# Patient Record
Sex: Male | Born: 1975 | Hispanic: Yes | Smoking: Former smoker
Health system: Southern US, Community
[De-identification: ages and names within clinical notes are randomized; demographics above are authoritative.]

## PROBLEM LIST (undated history)

## (undated) DIAGNOSIS — K5190 Ulcerative colitis, unspecified, without complications: Secondary | ICD-10-CM

## (undated) DIAGNOSIS — J45909 Unspecified asthma, uncomplicated: Secondary | ICD-10-CM

## (undated) DIAGNOSIS — F32A Depression, unspecified: Secondary | ICD-10-CM

## (undated) DIAGNOSIS — R7989 Other specified abnormal findings of blood chemistry: Secondary | ICD-10-CM

## (undated) DIAGNOSIS — R42 Dizziness and giddiness: Secondary | ICD-10-CM

## (undated) DIAGNOSIS — F419 Anxiety disorder, unspecified: Secondary | ICD-10-CM

## (undated) DIAGNOSIS — E7800 Pure hypercholesterolemia, unspecified: Secondary | ICD-10-CM

## (undated) HISTORY — PX: VASECTOMY: SHX75

## (undated) HISTORY — DX: Dizziness and giddiness: R42

## (undated) HISTORY — DX: Ulcerative colitis, unspecified, without complications: K51.90

## (undated) HISTORY — DX: Unspecified asthma, uncomplicated: J45.909

## (undated) HISTORY — DX: Other specified abnormal findings of blood chemistry: R79.89

## (undated) HISTORY — DX: Pure hypercholesterolemia, unspecified: E78.00

## (undated) HISTORY — DX: Anxiety disorder, unspecified: F41.9

## (undated) HISTORY — PX: MOUTH SURGERY: SHX715

## (undated) HISTORY — DX: Depression, unspecified: F32.A

---

## 2004-04-09 ENCOUNTER — Emergency Department (HOSPITAL_COMMUNITY): Admission: EM | Admit: 2004-04-09 | Discharge: 2004-04-10 | Payer: Self-pay | Admitting: Emergency Medicine

## 2004-05-07 ENCOUNTER — Ambulatory Visit: Payer: Self-pay | Admitting: Internal Medicine

## 2004-05-13 ENCOUNTER — Ambulatory Visit: Payer: Self-pay | Admitting: Internal Medicine

## 2006-09-01 HISTORY — PX: COLONOSCOPY: SHX174

## 2006-11-10 ENCOUNTER — Encounter: Admission: RE | Admit: 2006-11-10 | Discharge: 2006-11-10 | Payer: Self-pay | Admitting: Gastroenterology

## 2007-09-29 ENCOUNTER — Encounter: Payer: Self-pay | Admitting: Family Medicine

## 2008-05-10 ENCOUNTER — Encounter: Payer: Self-pay | Admitting: Family Medicine

## 2009-04-04 ENCOUNTER — Ambulatory Visit: Payer: Self-pay | Admitting: Family Medicine

## 2009-04-04 DIAGNOSIS — E7800 Pure hypercholesterolemia, unspecified: Secondary | ICD-10-CM | POA: Insufficient documentation

## 2009-04-04 DIAGNOSIS — R7989 Other specified abnormal findings of blood chemistry: Secondary | ICD-10-CM | POA: Insufficient documentation

## 2009-04-04 DIAGNOSIS — R945 Abnormal results of liver function studies: Secondary | ICD-10-CM

## 2009-04-04 DIAGNOSIS — K5190 Ulcerative colitis, unspecified, without complications: Secondary | ICD-10-CM | POA: Insufficient documentation

## 2009-04-05 ENCOUNTER — Encounter (INDEPENDENT_AMBULATORY_CARE_PROVIDER_SITE_OTHER): Payer: Self-pay | Admitting: *Deleted

## 2009-04-12 ENCOUNTER — Ambulatory Visit: Payer: Self-pay | Admitting: Family Medicine

## 2009-04-12 LAB — CONVERTED CEMR LAB
AST: 43 units/L — ABNORMAL HIGH (ref 0–37)
Albumin: 4 g/dL (ref 3.5–5.2)
Alkaline Phosphatase: 93 units/L (ref 39–117)
Cholesterol: 282 mg/dL — ABNORMAL HIGH (ref 0–200)
Direct LDL: 222.9 mg/dL
Total CHOL/HDL Ratio: 7
Triglycerides: 127 mg/dL (ref 0.0–149.0)
VLDL: 25.4 mg/dL (ref 0.0–40.0)

## 2009-04-18 LAB — CONVERTED CEMR LAB: HCV Ab: NEGATIVE

## 2009-04-26 ENCOUNTER — Telehealth: Payer: Self-pay | Admitting: Family Medicine

## 2009-11-06 ENCOUNTER — Ambulatory Visit: Payer: Self-pay | Admitting: Family Medicine

## 2009-11-06 DIAGNOSIS — R5381 Other malaise: Secondary | ICD-10-CM | POA: Insufficient documentation

## 2009-11-06 DIAGNOSIS — R5383 Other fatigue: Secondary | ICD-10-CM

## 2009-11-09 LAB — CONVERTED CEMR LAB
ALT: 48 units/L (ref 0–53)
Albumin: 4.4 g/dL (ref 3.5–5.2)
Basophils Absolute: 0 10*3/uL (ref 0.0–0.1)
CO2: 32 meq/L (ref 19–32)
Chloride: 106 meq/L (ref 96–112)
Eosinophils Absolute: 0.1 10*3/uL (ref 0.0–0.7)
Eosinophils Relative: 1.7 % (ref 0.0–5.0)
GFR calc non Af Amer: 117.27 mL/min (ref >60)
Glucose, Bld: 87 mg/dL (ref 70–99)
HCT: 44.3 % (ref 39.0–52.0)
HDL: 43.3 mg/dL (ref >39.00)
Hemoglobin: 14.8 g/dL (ref 13.0–17.0)
MCV: 83.5 fL (ref 78.0–100.0)
Platelets: 200 10*3/uL (ref 150.0–400.0)
RBC: 5.31 M/uL (ref 4.22–5.81)
Total Bilirubin: 0.8 mg/dL (ref 0.3–1.2)
Total Protein: 7.9 g/dL (ref 6.0–8.3)
Triglycerides: 170 mg/dL — ABNORMAL HIGH (ref 0.0–149.0)
VLDL: 34 mg/dL (ref 0.0–40.0)
WBC: 6 10*3/uL (ref 4.5–10.5)

## 2009-11-16 ENCOUNTER — Telehealth: Payer: Self-pay | Admitting: Internal Medicine

## 2009-11-16 ENCOUNTER — Ambulatory Visit: Payer: Self-pay | Admitting: Internal Medicine

## 2009-11-16 DIAGNOSIS — R209 Unspecified disturbances of skin sensation: Secondary | ICD-10-CM | POA: Insufficient documentation

## 2009-11-19 ENCOUNTER — Telehealth: Payer: Self-pay | Admitting: Internal Medicine

## 2009-11-19 LAB — CONVERTED CEMR LAB
AST: 32 units/L (ref 0–37)
Alkaline Phosphatase: 106 units/L (ref 39–117)
Basophils Relative: 0.5 % (ref 0.0–3.0)
CO2: 32 meq/L (ref 19–32)
Chloride: 104 meq/L (ref 96–112)
Eosinophils Absolute: 0.1 10*3/uL (ref 0.0–0.7)
Hemoglobin: 15.5 g/dL (ref 13.0–17.0)
Monocytes Relative: 7.4 % (ref 3.0–12.0)
Neutrophils Relative %: 63.5 % (ref 43.0–77.0)
Phosphorus: 4.5 mg/dL (ref 2.3–4.6)
Platelets: 209 10*3/uL (ref 150.0–400.0)
Potassium: 4.1 meq/L (ref 3.5–5.1)
RBC: 5.69 M/uL (ref 4.22–5.81)
Sodium: 140 meq/L (ref 135–145)
Total Bilirubin: 0.8 mg/dL (ref 0.3–1.2)
Vitamin B-12: 499 pg/mL (ref 211–911)
WBC: 5.7 10*3/uL (ref 4.5–10.5)

## 2009-11-21 ENCOUNTER — Encounter: Payer: Self-pay | Admitting: Internal Medicine

## 2009-11-21 ENCOUNTER — Telehealth: Payer: Self-pay | Admitting: Internal Medicine

## 2010-10-02 NOTE — Progress Notes (Signed)
Summary: Facial numbness  Phone Note Call from Patient   Caller: Patient Call For: Owens Loffler MD Summary of Call: Patient calls and states that he is having one sided facial numbness.  This has been going on for about two days.  No SOB, no wheezing, no chest pain, no dizziness, no headache.  He said that his tongue is feeling a little funny as well.  He blames it on eating a lot of hard, sour candy.  I told patient that facial numbness may mean many different things and he shouldn't take it lightly. He says that he is in no acute distress he says that he often has panic attacks as well. Initial call taken by: Sherrian Divers CMA Deborra Medina),  November 16, 2009 11:06 AM  Follow-up for Phone Call        spoke with pt and he will come in to the office today at 12:15, per Dr. Silvio Pate Follow-up by: Edwin Dada CMA Deborra Medina),  November 16, 2009 11:44 AM

## 2010-10-02 NOTE — Progress Notes (Signed)
Summary: face is still numb  Phone Note Call from Patient Call back at Home Phone [phone]   Caller: Patient Summary of Call: Pt states his face is still numb, lost  feeling in his left arm for awhile last night  but the feeling came back after he took clonazepam.  Feels like he has less control in the left side of his face.  Not able to play video games, has to stop after a couple of minutes because his eyes start tearing up, ok with watching tv though.  Reports that he had one headache on saturday, lasted 5-10 minutes.  Please advise pt as to what could be going on. Initial call taken by: Marty Heck CMA,  November 19, 2009 11:52 AM  Follow-up for Phone Call        If he has ongoing symptoms, we can make referral to neurologist  for second opinion If he would like this, please send note to Jennye Moccasin MD  November 19, 2009 1:08 PM   pt would like referral to neurology [place] Deborra Medina)  November 19, 2009 3:50 PM   Additional Follow-up for Phone Call Additional follow up Details #1::        Referral faxed to [facility] trying ti get the patient in within 2 weeks first in [place] . Additional Follow-up by: Haynes Bast,  November 19, 2009 5:00 PM     Appended Document: face is still numb Neurology consult set up with Dr Dohmeier on 11/21/2009 at 10:00am, patient called amnd notified about appt. Haynes Bast 11/20/2009.

## 2010-10-02 NOTE — Progress Notes (Signed)
  Phone Note Other Incoming   Caller: Dr Dohmeier Summary of Call: Has facial palsy prescribing famvir and eye drops Asks if any contraindication to prednisone due to the colitis  No--okay to use she will follow up with him soon Initial call taken by: Claris Gower MD,  November 21, 2009 11:03 AM

## 2010-10-02 NOTE — Letter (Signed)
Summary: Dr.Carmen Dohmeier,[facility],Note  Dr.Carmen Dohmeier,[facility],Note   Imported By: Virgia Land 11/27/2009 16:53:34  _____________________________________________________________________  External Attachment:    Type:   Image     Comment:   External Document  Appended Document: Dr.Carmen Dohmeier,[facility],Note Bell's palsy

## 2010-10-02 NOTE — Assessment & Plan Note (Signed)
Summary: CPX/CLE   Vital Signs:  Patient profile:   35 year old male Height:      68 inches Weight:      153.2 pounds BMI:     23.38 Temp:     98.0 degrees F oral Pulse rate:   72 / minute Pulse rhythm:   regular BP sitting:   90 / 58  (left arm) Cuff size:   regular  Vitals Entered By: Zenda Alpers CMA Deborra Medina) (November 06, 2009 8:27 AM)  History of Present Illness: Chief complaint cpx  Elevated AST / ALT  Elevated cholesterol  gets some panic attacks with stoamche issues and uc  cut out soda. has been eating really well and this has helped his uc.   Preventive Screening-Counseling & Management  Alcohol-Tobacco     Alcohol drinks/day: 0     Alcohol Counseling: not indicated; patient does not drink     Smoking Status: quit     Tobacco Counseling: to remain off tobacco products  Caffeine-Diet-Exercise     Diet Counseling: to improve diet; diet is suboptimal     Does Patient Exercise: no     Exercise Counseling: to improve exercise regimen  Hep-HIV-STD-Contraception     STD Risk: no risk noted     Testicular SE Education/Counseling to perform regular STE      Sexual History:  currently monogamous.        Drug Use:  never.    Clinical Review Panels:  Lipid Management   Cholesterol:  282 (04/04/2009)   HDL (good cholesterol):  42.20 (04/04/2009)  Complete Metabolic Panel   Albumin:  4.0 (04/04/2009)   Total Protein:  7.8 (04/04/2009)   Total Bili:  0.9 (04/04/2009)   Alk Phos:  93 (04/04/2009)   SGPT (ALT):  59 (04/04/2009)   SGOT (AST):  43 (04/04/2009)   Allergies (verified): No Known Drug Allergies  Past History:  Past medical, surgical, family and social histories (including risk factors) reviewed, and no changes noted (except as noted below).  Past Medical History: Reviewed history from 04/04/2009 and no changes required. COLITIS, ULCERATIVE (ICD-556.9) HYPERCHOLESTEROLEMIA (ICD-272.0)    Past Surgical History: Reviewed history from  04/04/2009 and no changes required. none  Past History:  Care Management: Gastroenterology: Dr. Collene Mares, [place]  Family History: Reviewed history from 04/04/2009 and no changes required. Patient was adopted  Social History: Reviewed history from 04/04/2009 and no changes required. [place], (Film) Alcohol use-no Drug use-no Regular exercise-no Married StudentSmoking Status:  quit STD Risk:  no risk noted Sexual History:  currently monogamous Drug Use:  never  Review of Systems  General: Denies fever, chills, sweats, and anorexia. Eyes: Denies blurring. ENT: Denies earache, ear discharge, decreased hearing, nasal congestion, and sore throat. CV: Denies chest pains, dyspnea on exertion, palpitations, and syncope. Resp: Denies cough, cough with exercise, dyspnea at rest, excessive sputum, nighttime cough or wheeze, and wheezing GI: Occ UC flares, mostly two times a day stools GU: dysuria, discharge, frequency,genital sores, STD concern. MS: no back pain, joint pain, stiffness, and arthritis. Derm: No rash, itching, and dryness Neuro: No abnormal gait, frequent headaches, paresthesias, seizures, vertigo, and weakness Psych: No anxiety, behavioral problems, compulsive behavior, depression, hyperactivity, and inattentive. Endo: No polydipsia, polyphagia, polyuria, and unusual weight change Heme: No bruising or LAD Allergy: No urticaria or hayfever   Otherwise, the pertinent positives and negatives are listed above and in the HPI, otherwise a full review of systems has been reviewed and is negative  unless noted positive.   Physical Exam  General:  Well-developed,well-nourished,in no acute distress; alert,appropriate and cooperative throughout examination Head:  Normocephalic and atraumatic without obvious abnormalities. No apparent alopecia or balding. Eyes:  vision grossly intact, pupils equal, pupils round, pupils reactive to light, and pupils react to  accomodation.   Ears:  External ear exam shows no significant lesions or deformities.  Otoscopic examination reveals clear canals, tympanic membranes are intact bilaterally without bulging, retraction, inflammation or discharge. Hearing is grossly normal bilaterally. Nose:  External nasal examination shows no deformity or inflammation. Nasal mucosa are pink and moist without lesions or exudates. Mouth:  Oral mucosa and oropharynx without lesions or exudates.  Teeth in good repair. Neck:  No deformities, masses, or tenderness noted. Lungs:  Normal respiratory effort, chest expands symmetrically. Lungs are clear to auscultation, no crackles or wheezes. Heart:  Normal rate and regular rhythm. S1 and S2 normal without gallop, murmur, click, rub or other extra sounds. Abdomen:  Bowel sounds positive,abdomen soft and non-tender without masses, organomegaly or hernias noted. Genitalia:  Testes bilaterally descended without nodularity, tenderness or masses. No scrotal masses or lesions. No penis lesions or urethral discharge. Msk:  normal ROM and no crepitation.   Extremities:  No clubbing, cyanosis, edema, or deformity noted with normal full range of motion of all joints.   Neurologic:  alert & oriented X3 and gait normal.   Skin:  Intact without suspicious lesions or rashes Cervical Nodes:  No lymphadenopathy noted Inguinal Nodes:  No significant adenopathy Psych:  Cognition and judgment appear intact. Alert and cooperative with normal attention span and concentration. No apparent delusions, illusions, hallucinations   Impression & Recommendations:  Problem # 1:  [place] (ICD-V70.0) The patient's preventative maintenance and recommended screening tests for an annual wellness exam were reviewed in full today. Brought up to date unless services declined.  Counselled on the importance of diet, exercise, and its role in overall health and mortality. The patient's FH and SH was  reviewed, including their home life, tobacco status, and drug and alcohol status.   recheck labs  Other Orders: Venipuncture (70786) TLB-Lipid Panel (80061-LIPID) TLB-BMP (Basic Metabolic Panel-BMET) (75449-EEFEOFH) TLB-CBC Platelet - w/Differential (85025-CBCD) TLB-Hepatic/Liver Function Pnl (80076-HEPATIC)  Prior Medications (reviewed today): None Current Allergies (reviewed today): No known allergies

## 2010-10-02 NOTE — Assessment & Plan Note (Signed)
Summary: FACIAL NUMBNESS/DS   Vital Signs:  Patient profile:   35 year old male Weight:      154 pounds O2 Sat:      99 % on Room air Temp:     98.8 degrees F oral Pulse rate:   60 / minute Pulse rhythm:   regular BP sitting:   110 / 70  (left arm) Cuff size:   regular  Vitals Entered By: Edwin Dada CMA Deborra Medina) (November 16, 2009 12:53 PM)  O2 Flow:  Room air CC: facial numbness and dry eyes   History of Present Illness: About 8 days ago--tongue "went funny" having some trouble keeping eyes open--feels dry Yesterday had feeling of numbness bilaterally on face from eyes down still feels tingly  No facial droop--but thought there may be some looseness on left mouth (can't hold air in when he puffs out mouth)  Some headaches but nothing notable of late  NO loss of vision or diplopia No focal arm or leg weakness  No speech or swallowing problems  eating lots of candy bit his right cheek  Allergies: No Known Drug Allergies  Past History:  Past medical, surgical, family and social histories (including risk factors) reviewed for relevance to current acute and chronic problems.  Past Medical History: Reviewed history from 04/04/2009 and no changes required. COLITIS, ULCERATIVE (ICD-556.9) HYPERCHOLESTEROLEMIA (ICD-272.0)    Past Surgical History: Reviewed history from 04/04/2009 and no changes required. none  Family History: Reviewed history from 04/04/2009 and no changes required. Patient was adopted  Social History: Reviewed history from 04/04/2009 and no changes required. [place], ([place]) Alcohol use-no Drug use-no Regular exercise-no Married Student  Review of Systems       has "panic" symptoms---worries about things due to his high cholesterol More like he gets nervous about things No meds for ulcerative colitis--generally quiet currently hasn't started the crestor  Physical Exam  General:  alert and normal  appearance.   Head:  normocephalic and atraumatic.   Eyes:  pupils equal, pupils round, pupils reactive to light, and no optic disk abnormalities.  Very slight horizontal nystagmus with lateral gaze Mouth:  no erythema, no exudates, and no lesions.   Neck:  supple, no masses, and no cervical lymphadenopathy.   Neurologic:  alert & oriented X3, cranial nerves II-XII intact, strength normal in all extremities, gait normal, finger-to-nose normal, and Romberg negative.   Very slight asymmetry at angles of mouth with left slightly looser Skin:  no rashes and no suspicious lesions.   Psych:  normally interactive, good eye contact, not depressed appearing, and slightly anxious.     Impression & Recommendations:  Problem # 1:  DISTURBANCE OF SKIN SENSATION (ICD-782.0) Assessment New  doesn't follow cranial nerve distribution being bilat slight horizontal nystagmus and slight facial asymmetry on left (very soft findings) will check labs to be sure no metabolic cause consider MRI if new symptoms or signs  Orders: Venipuncture (30076) TLB-Renal Function Panel (80069-RENAL) TLB-CBC Platelet - w/Differential (85025-CBCD) TLB-TSH (Thyroid Stimulating Hormone) (84443-TSH) TLB-Hepatic/Liver Function Pnl (80076-HEPATIC) TLB-B12, Serum-Total ONLY (22633-H54)  Complete Medication List: 1)  Crestor 40 Mg Tabs (Rosuvastatin calcium) .... Take one tablet by mouth at bedtime  Patient Instructions: 1)  Please schedule a follow-up appointment as needed .  2)  Please call if you have persistent sensation changes or anything new happens  Current Allergies (reviewed today): No known allergies

## 2011-08-22 ENCOUNTER — Ambulatory Visit (INDEPENDENT_AMBULATORY_CARE_PROVIDER_SITE_OTHER): Payer: BC Managed Care – PPO

## 2011-08-22 DIAGNOSIS — J111 Influenza due to unidentified influenza virus with other respiratory manifestations: Secondary | ICD-10-CM

## 2011-08-22 DIAGNOSIS — R509 Fever, unspecified: Secondary | ICD-10-CM

## 2011-09-01 ENCOUNTER — Telehealth: Payer: Self-pay | Admitting: *Deleted

## 2011-09-01 NOTE — Telephone Encounter (Signed)
Can you get a little more information -   Does he get anaphylaxis? Does he have an egg allergy? History of guillan-barre? Diffuse rash?  What happened when he took it?

## 2011-09-01 NOTE — Telephone Encounter (Signed)
Patient calling and asking for a letter stating that he can not take the flu shot because he gets really sick from the shot.

## 2011-09-04 NOTE — Telephone Encounter (Signed)
Tried to reach patient and number not working

## 2011-09-05 NOTE — Telephone Encounter (Signed)
I spoke w/pt - pt was given flu vaccine 2 years ago and immediately c/o vomiting, fever and chills. He is requesting a letter from MD for his employer. Please advise. (pt is aware that MD is out of the office all of next week)

## 2011-09-05 NOTE — Telephone Encounter (Signed)
Okay write up letter stating he cannot tolerate flu vaccine dur to those SE. And I will sign.

## 2011-09-08 ENCOUNTER — Encounter: Payer: Self-pay | Admitting: *Deleted

## 2011-09-08 NOTE — Telephone Encounter (Signed)
Letter printed and will mail to patient

## 2011-11-17 ENCOUNTER — Ambulatory Visit (INDEPENDENT_AMBULATORY_CARE_PROVIDER_SITE_OTHER): Payer: Self-pay | Admitting: Family Medicine

## 2011-11-17 ENCOUNTER — Encounter: Payer: Self-pay | Admitting: Family Medicine

## 2011-11-17 VITALS — BP 116/80 | HR 76 | Temp 98.6°F | Wt 141.8 lb

## 2011-11-17 DIAGNOSIS — Z23 Encounter for immunization: Secondary | ICD-10-CM

## 2011-11-17 DIAGNOSIS — W503XXA Accidental bite by another person, initial encounter: Secondary | ICD-10-CM

## 2011-11-17 DIAGNOSIS — T148XXA Other injury of unspecified body region, initial encounter: Secondary | ICD-10-CM

## 2011-11-17 DIAGNOSIS — T1490XA Injury, unspecified, initial encounter: Secondary | ICD-10-CM

## 2011-11-17 MED ORDER — AMOXICILLIN-POT CLAVULANATE 875-125 MG PO TABS: 1.0000 | ORAL_TABLET | Freq: Two times a day (BID) | ORAL | Status: AC
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
## 2011-11-17 NOTE — Patient Instructions (Signed)
If you feel you are in danger you need to call 911. Tetanus shot today (Tdap). Take antibiotic as prescribed. Call us with any questions.
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
## 2013-11-30 ENCOUNTER — Ambulatory Visit: Payer: BC Managed Care – PPO | Admitting: Family Medicine

## 2013-12-07 ENCOUNTER — Ambulatory Visit (INDEPENDENT_AMBULATORY_CARE_PROVIDER_SITE_OTHER): Payer: BC Managed Care – PPO | Admitting: Family Medicine

## 2013-12-07 ENCOUNTER — Ambulatory Visit: Payer: BC Managed Care – PPO | Admitting: Family Medicine

## 2013-12-07 ENCOUNTER — Encounter: Payer: Self-pay | Admitting: Family Medicine

## 2013-12-07 VITALS — BP 90/58 | HR 63 | Temp 98.2°F | Ht 67.0 in | Wt 149.0 lb

## 2013-12-07 DIAGNOSIS — M25569 Pain in unspecified knee: Secondary | ICD-10-CM

## 2013-12-07 DIAGNOSIS — M222X1 Patellofemoral disorders, right knee: Secondary | ICD-10-CM

## 2013-12-07 DIAGNOSIS — Z209 Contact with and (suspected) exposure to unspecified communicable disease: Secondary | ICD-10-CM

## 2013-12-07 DIAGNOSIS — M222X2 Patellofemoral disorders, left knee: Secondary | ICD-10-CM

## 2013-12-07 NOTE — Progress Notes (Signed)
Pre visit review using our clinic review tool, if applicable. No additional management support is needed unless otherwise documented below in the visit note. 

## 2013-12-07 NOTE — Progress Notes (Signed)
Date:  12/07/2013   Name:  Justin Barrera   DOB:  [birth year]   MRN:  [id number]  Primary Physician:  Owens Loffler, MD   Chief Complaint: STD Check and problems with joints   Subjective:   History of Present Illness:  Justin Barrera is a 38 y.o. very pleasant male patient who presents with the following:  Divorced now, new girlfriend, and she has herpes. Wants to get fully checked for STD's.  Also with B knee pain  Patient presents with multi-year h/o B knee pain after rising from a seated position and going up and down stairs. No audible pop was heard. The patient has not had an effusion. No symptomatic giving-way. No mechanical clicking. Joint has not locked up. Patient has been able to walk. The patient does have pain going up and down stairs or rising from a seated position.   Pain location: anterior Current physical activity: minimal Prior Knee Surgery: none Current pain meds: none Bracing: none  Past Medical History, Surgical History, Social History, Family History, Problem List, Medications, and Allergies have been reviewed and updated if relevant.  Review of Systems:  GEN: No acute illnesses, no fevers, chills. GI: No n/v/d, eating normally Pulm: No SOB Interactive and getting along well at home.  Otherwise, ROS is as per the HPI.  Objective:   Physical Examination: BP 90/58  Pulse 63  Temp(Src) 98.2 F (36.8 C) (Oral)  Ht 5' 7"  (1.702 m)  Wt 149 lb (67.586 kg)  BMI 23.33 kg/m2   GEN: WDWN, NAD, Non-toxic, A & O x 3 HEENT: Atraumatic, Normocephalic. Neck supple. No masses, No LAD. Ears and Nose: No external deformity. CV: RRR, No M/G/R. No JVD. No thrill. No extra heart sounds. PULM: CTA B, no wheezes, crackles, rhonchi. No retractions. No resp. distress. No accessory muscle use. EXTR: No c/c/e NEURO Normal gait.  PSYCH: Normally interactive. Conversant. Not depressed or anxious appearing.  Calm demeanor.   Knee: B Gait: Normal heel toe  pattern ROM: WNL Effusion: neg Echymosis or edema: none Patellar tendon NT Painful PLICA: neg Patellar grind: POS Medial and lateral patellar facet loading: mild pOSITIVE medial and lateral joint lines:NT Mcmurray's neg Flexion-pinch neg Varus and valgus stress: stable Lachman: neg Ant and Post drawer: neg Hip abduction, IR, ER: WNL Hip flexion str: 5/5 Hip abd: 5/5 Quad: 5/5 VMO atrophy: MILD Hamstring concentric and eccentric: 5/5   Laboratory and Imaging Data: Results for orders placed in visit on 12/07/13  TRICHOMONAS VAGINALIS, PROBE AMP      Result Value Ref Range   T vaginalis RNA Negative    GC/CHLAMYDIA PROBE AMP, URINE      Result Value Ref Range   Chlamydia, Swab/Urine, PCR NEGATIVE  NEGATIVE   GC Probe Amp, Urine NEGATIVE  NEGATIVE  RPR      Result Value Ref Range   RPR NON REAC  NON REAC  HIV ANTIBODY (ROUTINE TESTING)      Result Value Ref Range   HIV 1&2 Ab, 4th Generation NON-REACTIVE  NON-REACTIVE  HSV(HERPES SMPLX)ABS-I+II(IGG+IGM)-BLD      Result Value Ref Range   HSV 1 Glycoprotein G Ab, IgG 10.73 (*)    HSV 2 Glycoprotein G Ab, IgG 0.20     Herpes Simplex Vrs I&II-IgM Ab (EIA) 0.48    HEPATITIS B CORE ANTIBODY, IGM      Result Value Ref Range   Hep B C IgM NON REACTIVE  NON REACTIVE  HEPATITIS B SURFACE ANTIBODY  Result Value Ref Range   Hep B S Ab NEG  NEGATIVE  HEPATITIS B SURFACE ANTIGEN      Result Value Ref Range   Hepatitis B Surface Ag NEGATIVE  NEGATIVE  HEPATITIS C ANTIBODY      Result Value Ref Range   HCV Ab NEGATIVE  NEGATIVE     Assessment & Plan:   Exposure to communicable disease - Plan: GC/chlamydia probe amp, urine, RPR, HIV antibody, Trichomonas vaginalis, RNA, HSV(herpes smplx)abs-1+2(IgG+IgM)-bld, Hepatitis B core antibody, IgM, Hepatitis B surface antibody, Hepatitis B surface antigen, Hepatitis C antibody  Patellofemoral syndrome, bilateral   HSV 1 positive only. Patient alerted.  Patellofemoral  Syndrome  Reviewed anatomy using anatomical model and how PFS occurs.  Given rehab basic exercises. Regular exercise, upright biking  Follow-up: No Follow-up on file.  New Prescriptions   No medications on file   Orders Placed This Encounter  Procedures  . Trichomonas vaginalis, RNA  . GC/chlamydia probe amp, urine  . RPR  . HIV antibody  . HSV(herpes smplx)abs-1+2(IgG+IgM)-bld  . Hepatitis B core antibody, IgM  . Hepatitis B surface antibody  . Hepatitis B surface antigen  . Hepatitis C antibody   There are no Patient Instructions on file for this visit.  Signed,  Maud Deed. George Haggart, MD, [place] [facility] [place] [address] Phone: [phone] Fax: [phone]  Patient's Medications  New Prescriptions   No medications on file  Previous Medications   No medications on file  Modified Medications   No medications on file  Discontinued Medications   ROSUVASTATIN (CRESTOR) 40 MG TABLET    Take 40 mg by mouth at bedtime.

## 2013-12-08 ENCOUNTER — Encounter: Payer: Self-pay | Admitting: *Deleted

## 2013-12-08 LAB — GC/CHLAMYDIA PROBE AMP, URINE
Chlamydia, Swab/Urine, PCR: NEGATIVE
GC Probe Amp, Urine: NEGATIVE

## 2013-12-08 LAB — HSV(HERPES SMPLX)ABS-I+II(IGG+IGM)-BLD
HSV 1 Glycoprotein G Ab, IgG: 10.73 IV — ABNORMAL HIGH
HSV 2 Glycoprotein G Ab, IgG: 0.2 IV
Herpes Simplex Vrs I&II-IgM Ab (EIA): 0.48 INDEX

## 2013-12-08 LAB — HEPATITIS C ANTIBODY: HCV AB: NEGATIVE

## 2013-12-08 LAB — TRICHOMONAS VAGINALIS, PROBE AMP: T vaginalis RNA: NEGATIVE

## 2013-12-08 LAB — RPR

## 2013-12-08 LAB — HIV ANTIBODY (ROUTINE TESTING W REFLEX): HIV 1&2 Ab, 4th Generation: NONREACTIVE

## 2013-12-08 LAB — HEPATITIS B SURFACE ANTIGEN: HEP B S AG: NEGATIVE

## 2013-12-08 LAB — HEPATITIS B SURFACE ANTIBODY,QUALITATIVE: Hep B S Ab: NEGATIVE

## 2013-12-08 LAB — HEPATITIS B CORE ANTIBODY, IGM: Hep B C IgM: NONREACTIVE

## 2013-12-09 ENCOUNTER — Encounter: Payer: Self-pay | Admitting: Family Medicine

## 2014-01-04 ENCOUNTER — Encounter: Payer: Self-pay | Admitting: *Deleted

## 2014-01-04 ENCOUNTER — Encounter: Payer: Self-pay | Admitting: Family Medicine

## 2014-01-04 ENCOUNTER — Ambulatory Visit (INDEPENDENT_AMBULATORY_CARE_PROVIDER_SITE_OTHER): Payer: BC Managed Care – PPO | Admitting: Family Medicine

## 2014-01-04 VITALS — BP 114/68 | HR 53 | Temp 98.1°F | Ht 67.0 in | Wt 149.5 lb

## 2014-01-04 DIAGNOSIS — S139XXA Sprain of joints and ligaments of unspecified parts of neck, initial encounter: Secondary | ICD-10-CM

## 2014-01-04 DIAGNOSIS — S239XXA Sprain of unspecified parts of thorax, initial encounter: Secondary | ICD-10-CM

## 2014-01-04 DIAGNOSIS — S134XXA Sprain of ligaments of cervical spine, initial encounter: Secondary | ICD-10-CM | POA: Insufficient documentation

## 2014-01-04 DIAGNOSIS — S29012A Strain of muscle and tendon of back wall of thorax, initial encounter: Secondary | ICD-10-CM | POA: Insufficient documentation

## 2014-01-04 MED ORDER — MELOXICAM 15 MG PO TABS: 15.0000 mg | ORAL_TABLET | Freq: Every day | ORAL | Status: DC

## 2014-01-04 MED ORDER — CYCLOBENZAPRINE HCL 10 MG PO TABS: 10.0000 mg | ORAL_TABLET | Freq: Every evening | ORAL | Status: DC | PRN

## 2014-01-04 NOTE — Patient Instructions (Signed)
Heat, massage, start meloxicam daily for 3-4 days, then as needed for inflammation and pain. Muscle relaxant at night as needed for spasm. Start home stretching exercises. Follow up if not improving as expected in 2 weeks.

## 2014-01-04 NOTE — Assessment & Plan Note (Signed)
Treat with NSAIDs, Muscle relaxants, heat, massage and start gentle stretches ( info given)

## 2014-01-04 NOTE — Progress Notes (Signed)
Pre visit review using our clinic review tool, if applicable. No additional management support is needed unless otherwise documented below in the visit note. 

## 2014-01-04 NOTE — Addendum Note (Signed)
Addended by: Carter Kitten on: 01/04/2014 04:34 PM   Modules accepted: Orders
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
## 2014-02-17 ENCOUNTER — Ambulatory Visit: Payer: BC Managed Care – PPO | Admitting: Family Medicine

## 2014-06-20 ENCOUNTER — Encounter: Payer: Self-pay | Admitting: Family Medicine

## 2014-06-20 ENCOUNTER — Ambulatory Visit (INDEPENDENT_AMBULATORY_CARE_PROVIDER_SITE_OTHER): Payer: BC Managed Care – PPO | Admitting: Family Medicine

## 2014-06-20 VITALS — BP 90/66 | HR 70 | Temp 98.1°F | Ht 67.0 in | Wt 156.8 lb

## 2014-06-20 DIAGNOSIS — J9801 Acute bronchospasm: Secondary | ICD-10-CM | POA: Insufficient documentation

## 2014-06-20 MED ORDER — ALBUTEROL SULFATE HFA 108 (90 BASE) MCG/ACT IN AERS: 2.0000 | INHALATION_SPRAY | Freq: Four times a day (QID) | RESPIRATORY_TRACT | Status: DC | PRN

## 2014-06-20 MED ORDER — BENZONATATE 200 MG PO CAPS: 200.0000 mg | ORAL_CAPSULE | Freq: Three times a day (TID) | ORAL | Status: DC | PRN

## 2014-06-20 NOTE — Progress Notes (Signed)
Pre visit review using our clinic review tool, if applicable. No additional management support is needed unless otherwise documented below in the visit note. 

## 2014-06-20 NOTE — Assessment & Plan Note (Signed)
Peak flows today are nml at 750.  Treat with albuterol inhaler prn, tessalon perles as needed and time.

## 2014-06-20 NOTE — Patient Instructions (Signed)
Peak flows today are normal at 750. Treat with albuterol inhaler prn, tessalon perles as needed and time!

## 2014-06-20 NOTE — Progress Notes (Signed)
   Subjective:    Patient ID: Justin Barrera, male    DOB: October 15, 1975, 38 y.o.   MRN: [id number]  Cough This is a new problem. The current episode started 1 to 4 weeks ago (felt worse initially, but has had perisitent dry cough. ). The problem has been gradually worsening. The problem occurs every few hours (when active he have coughing fits). The cough is non-productive. Associated symptoms include a fever and wheezing. Pertinent negatives include no ear congestion, ear pain, myalgias, nasal congestion, postnasal drip, rash, rhinorrhea, sore throat or shortness of breath. Associated symptoms comments: In beginning, none now. The symptoms are aggravated by exercise. Risk factors: non smoker. He has tried OTC cough suppressant (nyquil ) for the symptoms. The treatment provided mild relief. There is no history of asthma, bronchitis, COPD, emphysema, environmental allergies or pneumonia.   Works in hospital.   Daughter has asthma.    Review of Systems  Constitutional: Positive for fever.  HENT: Negative for ear pain, postnasal drip, rhinorrhea and sore throat.   Respiratory: Positive for cough and wheezing. Negative for shortness of breath.   Musculoskeletal: Negative for myalgias.  Skin: Negative for rash.  Allergic/Immunologic: Negative for environmental allergies.       Objective:   Physical Exam  Constitutional: Vital signs are normal. He appears well-developed and well-nourished.  Non-toxic appearance. He does not appear ill. No distress.  HENT:  Head: Normocephalic and atraumatic.  Right Ear: Hearing, tympanic membrane, external ear and ear canal normal. No tenderness. No foreign bodies. Tympanic membrane is not retracted and not bulging.  Left Ear: Hearing, tympanic membrane, external ear and ear canal normal. No tenderness. No foreign bodies. Tympanic membrane is not retracted and not bulging.  Nose: Nose normal. No mucosal edema or rhinorrhea. Right sinus exhibits no maxillary  sinus tenderness and no frontal sinus tenderness. Left sinus exhibits no maxillary sinus tenderness and no frontal sinus tenderness.  Mouth/Throat: Uvula is midline, oropharynx is clear and moist and mucous membranes are normal. Normal dentition. No dental caries. No oropharyngeal exudate or tonsillar abscesses.  Eyes: Conjunctivae, EOM and lids are normal. Pupils are equal, round, and reactive to light. Lids are everted and swept, no foreign bodies found.  Neck: Trachea normal, normal range of motion and phonation normal. Neck supple. Carotid bruit is not present. No mass and no thyromegaly present.  Cardiovascular: Normal rate, regular rhythm, S1 normal, S2 normal, normal heart sounds, intact distal pulses and normal pulses.  Exam reveals no gallop.   No murmur heard. Pulmonary/Chest: Effort normal and breath sounds normal. No respiratory distress. He has no wheezes. He has no rhonchi. He has no rales.  Abdominal: Soft. Normal appearance and bowel sounds are normal. There is no hepatosplenomegaly. There is no tenderness. There is no rebound, no guarding and no CVA tenderness. No hernia.  Neurological: He is alert. He has normal reflexes.  Skin: Skin is warm, dry and intact. No rash noted.  Psychiatric: He has a normal mood and affect. His speech is normal and behavior is normal. Judgment normal.          Assessment & Plan:

## 2015-03-16 ENCOUNTER — Ambulatory Visit (INDEPENDENT_AMBULATORY_CARE_PROVIDER_SITE_OTHER): Payer: BC Managed Care – PPO | Admitting: Family Medicine

## 2015-03-16 ENCOUNTER — Ambulatory Visit (INDEPENDENT_AMBULATORY_CARE_PROVIDER_SITE_OTHER)
Admission: RE | Admit: 2015-03-16 | Discharge: 2015-03-16 | Disposition: A | Discharge status: Home / Self Care | Payer: BC Managed Care – PPO | Source: Ambulatory Visit | Attending: Family Medicine | Admitting: Family Medicine

## 2015-03-16 ENCOUNTER — Encounter: Payer: Self-pay | Admitting: Family Medicine

## 2015-03-16 ENCOUNTER — Telehealth: Payer: Self-pay

## 2015-03-16 VITALS — BP 100/60 | HR 75 | Temp 98.5°F | Ht 67.0 in | Wt 151.0 lb

## 2015-03-16 DIAGNOSIS — J45991 Cough variant asthma: Secondary | ICD-10-CM

## 2015-03-16 DIAGNOSIS — R05 Cough: Secondary | ICD-10-CM

## 2015-03-16 DIAGNOSIS — R053 Chronic cough: Secondary | ICD-10-CM | POA: Insufficient documentation

## 2015-03-16 MED ORDER — MONTELUKAST SODIUM 10 MG PO TABS: 10.0000 mg | ORAL_TABLET | Freq: Every day | ORAL | Status: DC

## 2015-03-16 NOTE — Assessment & Plan Note (Signed)
Spirometry nml. Start Singulair daily.  if not improving consider pred taper vs inh steroid etc.

## 2015-03-16 NOTE — Telephone Encounter (Signed)
Pt left v/m; pt seen earlier today and request cb with CXR report.Please advise.

## 2015-03-16 NOTE — Telephone Encounter (Signed)
Justin Barrera notified by telephone that his chest x-ray was normal.

## 2015-03-16 NOTE — Progress Notes (Signed)
   Subjective:    Patient ID: Justin Barrera, male    DOB: 24-Jun-1976, 39 y.o.   MRN: [id number]  Cough This is a new problem. The current episode started more than 1 month ago (ongoing in last 9 months). The problem has been waxing and waning (triggered by perfume, chemicals at work, cold air , not triggered by exercsie). The problem occurs constantly. The cough is non-productive. Associated symptoms include nasal congestion and postnasal drip. Pertinent negatives include no chills, ear congestion, ear pain, fever, headaches, hemoptysis, rhinorrhea, sore throat, shortness of breath or wheezing. The symptoms are aggravated by dust, fumes and cold air. Risk factors: remote smoker, 4-5 pack year history. He has tried a beta-agonist inhaler for the symptoms. The treatment provided moderate relief. There is no history of asthma, bronchiectasis, bronchitis, COPD, emphysema, environmental allergies or pneumonia.    Seen in 06/2105 for cough after infection.  DX Post inflammatory bronchospasm.  Has had cough since.  Social History /Family History/Past Medical History reviewed and updated if needed. He is adopted. Daughter with history of  Needing inhaler, no diagnosed.  Works in hospital.  Review of Systems  Constitutional: Negative for fever and chills.  HENT: Positive for postnasal drip. Negative for ear pain, rhinorrhea and sore throat.   Respiratory: Positive for cough. Negative for hemoptysis, shortness of breath and wheezing.   Allergic/Immunologic: Negative for environmental allergies.  Neurological: Negative for headaches.       Objective:   Physical Exam  Constitutional: Vital signs are normal. He appears well-developed and well-nourished.  HENT:  Head: Normocephalic.  Right Ear: Hearing normal.  Left Ear: Hearing normal.  Nose: Nose normal.  Mouth/Throat: Oropharynx is clear and moist and mucous membranes are normal.  Neck: Trachea normal. Carotid bruit is not present. No thyroid  mass and no thyromegaly present.  Cardiovascular: Normal rate, regular rhythm and normal pulses.  Exam reveals no gallop, no distant heart sounds and no friction rub.   No murmur heard. No peripheral edema  Pulmonary/Chest: Effort normal and breath sounds normal. No respiratory distress. He has no decreased breath sounds. He has no wheezes.  Constant dry cough  Skin: Skin is warm, dry and intact. No rash noted.  Psychiatric: He has a normal mood and affect. His speech is normal and behavior is normal. Thought content normal.      Assessment & Plan:

## 2015-03-16 NOTE — Patient Instructions (Addendum)
We will call with X-ray results. Start singulair daily at bedtime. Call if cough not improving in next 3-4 weeks for consideration of steroid course or further evaluation.

## 2015-03-16 NOTE — Progress Notes (Signed)
Pre visit review using our clinic review tool, if applicable. No additional management support is needed unless otherwise documented below in the visit note. 

## 2015-03-16 NOTE — Assessment & Plan Note (Signed)
No meds causing.  Most consistent with cough variant asthma.  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

## 2016-08-21 ENCOUNTER — Ambulatory Visit (INDEPENDENT_AMBULATORY_CARE_PROVIDER_SITE_OTHER): Payer: BC Managed Care – PPO | Admitting: Family Medicine

## 2016-08-21 VITALS — BP 100/70 | HR 70 | Wt 154.0 lb

## 2016-08-21 DIAGNOSIS — S39012A Strain of muscle, fascia and tendon of lower back, initial encounter: Secondary | ICD-10-CM | POA: Diagnosis not present

## 2016-08-21 DIAGNOSIS — J45991 Cough variant asthma: Secondary | ICD-10-CM

## 2016-08-21 MED ORDER — PREDNISONE 20 MG PO TABS: ORAL_TABLET | ORAL | 0 refills | Status: DC

## 2016-08-21 MED ORDER — BECLOMETHASONE DIPROPIONATE 80 MCG/ACT IN AERS: 1.0000 | INHALATION_SPRAY | Freq: Two times a day (BID) | RESPIRATORY_TRACT | 12 refills | Status: DC

## 2016-08-21 NOTE — Progress Notes (Signed)
Dr. Frederico Hamman T. Jahquez Steffler, MD, [facility] [place], [address] Phone: [phone] Fax: [phone]  08/21/2016  Patient: Justin Barrera, MRN: [id number], DOB: 06-Sep-1975, 40 y.o.  Primary Physician:  Owens Loffler, MD   Chief Complaint  Patient presents with  . Back Pain  . Cough    still going on, "hard to breath"    Subjective:   Justin Barrera is a 40 y.o. very pleasant male patient who presents with the following:  Cough worse in the cold. Weather. Smoke - 20 + years.  Singulair. He try this for a few months last year, and have any improvement. He has been coughing now for more than a year with some improvement with bronchodilators. Normal chest x-ray in 2016.  Putting on pants this morning, felt like he pulled out his back. Now able to move around a little bit. Sharp pain fairly constantly. Bothersome. Trouble sitting.  Works at [employer] He is now having pain in the muscle of his low back.  No radicular symptoms. No bowel or bladder incontinence.   Past Medical History, Surgical History, Social History, Family History, Problem List, Medications, and Allergies have been reviewed and updated if relevant.  Patient Active Problem List   Diagnosis Date Noted  . Cough variant asthma 03/16/2015  . Upper back strain 01/04/2014  . Whiplash injury to neck 01/04/2014  . HYPERCHOLESTEROLEMIA 04/04/2009  . COLITIS, ULCERATIVE 04/04/2009  . LIVER FUNCTION TESTS, ABNORMAL, HX OF 04/04/2009    Past Medical History:  Diagnosis Date  . Pure hypercholesterolemia   . Ulcerative colitis, unspecified     No past surgical history on file.  Social History   Social History  . Marital status: Married    Spouse name: N/A  . Number of children: N/A  . Years of education: N/A   Occupational History  . Not on file.   Social History Main Topics  . Smoking status: Former Smoker    Quit date: 09/01/1997  . Smokeless  tobacco: Never Used  . Alcohol use Yes     Comment: occassionally  . Drug use: No  . Sexual activity: Yes    Partners: Female   Other Topics Concern  . Not on file   Social History Narrative  . No narrative on file    Family History  Problem Relation Age of Onset  . Adopted: Yes    Allergies  Allergen Reactions  . Latex Anaphylaxis    Medication list reviewed and updated in full in [place].  ROS: GEN: Acute illness details above GI: Tolerating PO intake GU: maintaining adequate hydration and urination Pulm: No SOB Interactive and getting along well at home.  Otherwise, ROS is as per the HPI.  Objective:   BP 100/70   Pulse 70   Wt 154 lb (69.9 kg)   SpO2 98%   BMI 24.12 kg/m    GEN: A and O x 3. WDWN. NAD.    ENT: Nose clear, ext NML.  No LAD.  No JVD.  TM's clear. Oropharynx clear.  PULM: Normal WOB, no distress. No crackles, wheezes, rhonchi. CV: RRR, no M/G/R, No rubs, No JVD.   EXT: warm and well-perfused, No c/c/e. PSYCH: Pleasant and conversant.   Neurovascularly intact in the lower extremities. Straight leg is negative, straight leg raise. Does have some diffuse tenderness around L4-S1 in the low back paraspinous musculature. SI joints are minimally tender. Excellent range of motion at  the hip.   Laboratory and Imaging Data:  Assessment and Plan:   Cough variant asthma  Low back strain, initial encounter  I'm any give him some prednisone from a diagnostic and therapeutic standpoint to see if this helps his pulmonary symptoms and makes them go away.  This certainly does seem to be asthma.  I'm going to place him on Qvar twice a day.  I suspect that the prednisone will also help his low back  Follow-up: No Follow-up on file.  Meds ordered this encounter  Medications  . predniSONE (DELTASONE) 20 MG tablet    Sig: 2 tablets po for 4 days, then 1 tab po for 3 days    Dispense:  11 tablet    Refill:  0  . beclomethasone (QVAR) 80  MCG/ACT inhaler    Sig: Inhale 1 puff into the lungs 2 (two) times daily.    Dispense:  1 Inhaler    Refill:  12   Medications Discontinued During This Encounter  Medication Reason  . benzonatate (TESSALON) 200 MG capsule Completed Course   No orders of the defined types were placed in this encounter.   Signed,  Maud Deed. Danean Marner, MD   Allergies as of 08/21/2016      Reactions   Latex Anaphylaxis      Medication List       Accurate as of 08/21/16 11:59 PM. Always use your most recent med list.          albuterol 108 (90 Base) MCG/ACT inhaler Commonly known as:  PROVENTIL HFA Inhale 2 puffs into the lungs every 6 (six) hours as needed for wheezing or shortness of breath (coughing fit).   beclomethasone 80 MCG/ACT inhaler Commonly known as:  QVAR Inhale 1 puff into the lungs 2 (two) times daily.   montelukast 10 MG tablet Commonly known as:  SINGULAIR Take 1 tablet (10 mg total) by mouth at bedtime.   predniSONE 20 MG tablet Commonly known as:  DELTASONE 2 tablets po for 4 days, then 1 tab po for 3 days

## 2016-08-25 ENCOUNTER — Encounter: Payer: Self-pay | Admitting: Family Medicine

## 2016-08-26 ENCOUNTER — Encounter: Payer: Self-pay | Admitting: Family Medicine

## 2016-08-26 ENCOUNTER — Telehealth: Payer: Self-pay | Admitting: Family Medicine

## 2016-08-26 NOTE — Telephone Encounter (Signed)
Message left for patient to return my call.  Letter is left in the front office for patient to pick up.

## 2016-08-26 NOTE — Telephone Encounter (Signed)
Pt needs letter explaining his restrictions for work. He moves patients for work.  cb number is [phone]

## 2016-08-26 NOTE — Telephone Encounter (Signed)
done

## 2016-08-26 NOTE — Telephone Encounter (Addendum)
Patient called back and notified that letter is left in the front office. Patient verbalized understanding

## 2016-10-09 ENCOUNTER — Ambulatory Visit (INDEPENDENT_AMBULATORY_CARE_PROVIDER_SITE_OTHER): Payer: BC Managed Care – PPO | Admitting: Physician Assistant

## 2016-10-09 VITALS — BP 124/72 | HR 111 | Temp 100.5°F | Ht 67.0 in | Wt 159.0 lb

## 2016-10-09 DIAGNOSIS — J989 Respiratory disorder, unspecified: Secondary | ICD-10-CM | POA: Diagnosis not present

## 2016-10-09 DIAGNOSIS — R059 Cough, unspecified: Secondary | ICD-10-CM

## 2016-10-09 DIAGNOSIS — R509 Fever, unspecified: Secondary | ICD-10-CM | POA: Diagnosis not present

## 2016-10-09 DIAGNOSIS — R05 Cough: Secondary | ICD-10-CM

## 2016-10-09 MED ORDER — ALBUTEROL SULFATE HFA 108 (90 BASE) MCG/ACT IN AERS: 2.0000 | INHALATION_SPRAY | Freq: Four times a day (QID) | RESPIRATORY_TRACT | 0 refills | Status: DC | PRN

## 2016-10-09 MED ORDER — OSELTAMIVIR PHOSPHATE 75 MG PO CAPS: 75.0000 mg | ORAL_CAPSULE | Freq: Two times a day (BID) | ORAL | 0 refills | Status: AC

## 2016-10-09 MED ORDER — BENZONATATE 100 MG PO CAPS: 100.0000 mg | ORAL_CAPSULE | Freq: Three times a day (TID) | ORAL | 0 refills | Status: DC | PRN

## 2016-10-09 MED ORDER — IPRATROPIUM BROMIDE 0.03 % NA SOLN: 2.0000 | Freq: Two times a day (BID) | NASAL | 0 refills | Status: DC

## 2016-10-09 MED ORDER — GUAIFENESIN ER 1200 MG PO TB12: 1.0000 | ORAL_TABLET | Freq: Two times a day (BID) | ORAL | 1 refills | Status: DC | PRN

## 2016-10-09 NOTE — Patient Instructions (Addendum)
Get plenty of rest and drink at least 64 ounces of water daily. Resume the Qvar. Continue it every day. Use the albuterol as needed for cough, shortness of breath, wheezing. Use acetaminophen as needed for fever, aches.    IF you received an x-ray today, you will receive an invoice from [facility]. Please contact [facility] at [phone] with questions or concerns regarding your invoice.   IF you received labwork today, you will receive an invoice from [facility]. Please contact [facility] at [phone] with questions or concerns regarding your invoice.   Our billing staff will not be able to assist you with questions regarding bills from these companies.  You will be contacted with the lab results as soon as they are available. The fastest way to get your results is to activate your My Chart account. Instructions are located on the last page of this paperwork. If you have not heard from [place] regarding the results in 2 weeks, please contact this office.

## 2016-10-09 NOTE — Progress Notes (Signed)
Patient ID: Justin Barrera, male     DOB: [birth year], 41 y.o.    MRN: [id number]  PCP: Owens Loffler, MD  Chief Complaint  Patient presents with  . Cough    X 2 days- pt states that he had a fever lasy night and this morning  . Sore Throat    X 1 day    Subjective:   This patient is new to this practice and presents for evaluation of cough and sore throat.  He has cough variant asthma and ulcerative colitis.  Cough and nasal congestion began 2 days ago, sore throat yesterday. Thick yellow sputum with coughing. Rhinorrhea, HA, fatigue and "soreness" of the head and body. Tmax 100.6. 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  This feels different from an asthma exacerbation, though he has not used Qvar in the past 2 days. Doesn't use the albuterol, under the impression that it was discontinued, but he has one inhaler in his locker at work.   No CP, SOB, ear symptoms. No nausea, vomiting or diarrhea. Last flare of UC was 2 weeks ago.    Review of Systems As above.  Prior to Admission medications   Medication Sig Start Date End Date Taking? Authorizing Provider  beclomethasone (QVAR) 80 MCG/ACT inhaler Inhale 1 puff into the lungs 2 (two) times daily. 08/21/16  Yes Spencer Copland, MD  albuterol (PROVENTIL HFA) 108 (90 BASE) MCG/ACT inhaler Inhale 2 puffs into the lungs every 6 (six) hours as needed for wheezing or shortness of breath (coughing fit). Patient not taking: Reported on 10/09/2016 06/20/14   Amy E Diona Browner, MD  montelukast (SINGULAIR) 10 MG tablet Take 1 tablet (10 mg total) by mouth at bedtime. Patient not taking: Reported on 10/09/2016 03/16/15   Jinny Sanders, MD     Allergies  Allergen Reactions  . Latex Anaphylaxis     Patient Active Problem List   Diagnosis Date Noted  . Cough variant asthma 03/16/2015  . Upper back strain 01/04/2014  . Whiplash injury to neck 01/04/2014  . HYPERCHOLESTEROLEMIA  04/04/2009  . COLITIS, ULCERATIVE 04/04/2009  . LIVER FUNCTION TESTS, ABNORMAL, HX OF 04/04/2009     Family History  Problem Relation Age of Onset  . Adopted: Yes     Social History   Social History  . Marital status: Married    Spouse name: N/A  . Number of children: N/A  . Years of education: N/A   Occupational History  . Not on file.   Social History Main Topics  . Smoking status: Former Smoker    Quit date: 09/01/1997  . Smokeless tobacco: Never Used     Comment: 20 year ago  . Alcohol use Yes     Comment: occassionally  . Drug use: No  . Sexual activity: Yes    Partners: Female   Other Topics Concern  . Not on file   Social History Narrative  . No narrative on file         Objective:  Physical Exam  Constitutional: He is oriented to person, place, and time. He appears well-developed and well-nourished. He is active and cooperative. No distress.  BP 124/72   Pulse (!) 111   Temp (!) 100.5 F (38.1 C) (Oral)   Ht 5' 7"  (1.702 m)   Wt 159 lb (72.1 kg)   SpO2 98%   BMI 24.90 kg/m   HENT:  Head: Normocephalic and atraumatic.  Right  Ear: Hearing and external ear normal.  Left Ear: Hearing and external ear normal.  Nose: Mucosal edema (mild) present. No rhinorrhea. No epistaxis.  Mouth/Throat: Uvula is midline and mucous membranes are normal. Posterior oropharyngeal erythema (mild) present. No oropharyngeal exudate, posterior oropharyngeal edema or tonsillar abscesses.  Eyes: Conjunctivae are normal. No scleral icterus.  Neck: Normal range of motion. Neck supple. No thyromegaly present.  Cardiovascular: Normal rate, regular rhythm and normal heart sounds.   Pulses:      Radial pulses are 2+ on the right side, and 2+ on the left side.  Pulmonary/Chest: Effort normal and breath sounds normal.  Lymphadenopathy:       Head (right side): No tonsillar, no preauricular, no posterior auricular and no occipital adenopathy present.       Head (left side): No  tonsillar, no preauricular, no posterior auricular and no occipital adenopathy present.    He has no cervical adenopathy.       Right: No supraclavicular adenopathy present.       Left: No supraclavicular adenopathy present.  Neurological: He is alert and oriented to person, place, and time. No sensory deficit.  Skin: Skin is warm, dry and intact. No rash noted. No cyanosis or erythema. Nails show no clubbing.  Psychiatric: His speech is normal and behavior is normal. His mood appears not anxious. His affect is blunt. His affect is not angry, not labile and not inappropriate. He does not exhibit a depressed mood.        Assessment & Plan:  1. Cough Exacerbation of cough variant asthma. Resume Qvar. Refilled albuterol (he was under the impression it had been discontinued, and the last fill was apparently in 2015) for PRN use. - benzonatate (TESSALON) 100 MG capsule; Take 1-2 capsules (100-200 mg total) by mouth 3 (three) times daily as needed for cough.  Dispense: 40 capsule; Refill: 0 - albuterol (PROVENTIL HFA) 108 (90 Base) MCG/ACT inhaler; Inhale 2 puffs into the lungs every 6 (six) hours as needed for wheezing or shortness of breath (coughing fit).  Dispense: 3.7 g; Refill: 0  2. Respiratory illness with fever Possible influenza. Given the risks of complications in patients with asthma, elect to treat with Tamiflu. Supportive care.  Anticipatory guidance.  RTC if symptoms worsen/persist. - ipratropium (ATROVENT) 0.03 % nasal spray; Place 2 sprays into both nostrils 2 (two) times daily.  Dispense: 30 mL; Refill: 0 - oseltamivir (TAMIFLU) 75 MG capsule; Take 1 capsule (75 mg total) by mouth 2 (two) times daily.  Dispense: 10 capsule; Refill: 0 - Guaifenesin (MUCINEX MAXIMUM STRENGTH) 1200 MG TB12; Take 1 tablet (1,200 mg total) by mouth every 12 (twelve) hours as needed.  Dispense: 14 tablet; Refill: 1   Fara Chute, PA-C Physician Assistant-Certified [facility] at [place]

## 2016-10-09 NOTE — Progress Notes (Signed)
Patient ID: Justin Barrera, male     DOB: 29-Apr-1976, 41 y.o.    MRN: [id number]  PCP: Owens Loffler, MD  Chief Complaint  Patient presents with  . Cough    X 2 days- pt states that he had a fever lasy night and this morning  . 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  Pt is a 41 yo male, former smoker, with a history of cough variant asthma and ulcerative colitis who presents with 2 days of cough and congestion and one day of sore throat. Pt states that his cough started yesterday and it is productive of thick yellow sputum. He also reports rhinorrhea, headaches, fatigue, and generalized "soreness" of his head and body. He reports a fever this AM of 100.6 F (Oral). 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 He did have a flu shot this year. He has tried mucinex with no relief. He denies asthma exacerbation, but has not used his Qvar inhaler in the past day. He no longer uses an PRN albuterol inhaler. He denies ear pain, hemoptysis, SOB, chest pain, palpitations, abdominal pain, nausea, vomiting, diarrhea, or constipation. His last UC flare was two weeks ago.   Review of Systems See HPI  Prior to Admission medications   Medication Sig Start Date End Date Taking? Authorizing Provider  beclomethasone (QVAR) 80 MCG/ACT inhaler Inhale 1 puff into the lungs 2 (two) times daily. 08/21/16  Yes Owens Loffler, MD                          Allergies  Allergen Reactions  . Latex Anaphylaxis     Patient Active Problem List   Diagnosis Date Noted  . Cough variant asthma 03/16/2015  . Upper back strain 01/04/2014  . Whiplash injury to neck 01/04/2014  . HYPERCHOLESTEROLEMIA 04/04/2009  . COLITIS, ULCERATIVE 04/04/2009  . LIVER FUNCTION TESTS, ABNORMAL, HX OF 04/04/2009     Family History  Problem Relation Age of Onset  . Adopted: Yes     Social History    Social History  . Marital status: Married    Spouse name: N/A  . Number of children: N/A  . Years of education: N/A   Occupational History  . Not on file.   Social History Main Topics  . Smoking status: Former Smoker    Quit date: 09/01/1997  . Smokeless tobacco: Never Used     Comment: 20 year ago  . Alcohol use Yes     Comment: occassionally  . Drug use: No  . Sexual activity: Yes    Partners: Female   Other Topics Concern  . Not on file   Social History Narrative  . No narrative on file         Objective:  Physical Exam HEENT: PERRLA. Throat moderately erythematous, no exudates. Ear canals clear bilaterally, TMs intact bilaterally, non-bulging. Neck is tender to palpation, no lymphadenopathy.  Pulm: Good respiratory effort. CTAB. No wheezes, rales, or rhonchi. CV: RRR. No M/R/G. Abd: Soft, non-tender, non-distended. + BS x 4 quadrants. MSK: Full ROM at neck without pain.     Vitals:   10/09/16 0824  BP: 124/72  Pulse: (!) 111  Temp: (!) 100.5 F (38.1 C)  SpO2: 98%  Assessment & Plan:  1. Cough Pt advised to  continue Qvar inhaler daily and restart Albuterol inhaler PRN. Pt advised to drink plenty of water and rest.  - benzonatate (TESSALON) 100 MG capsule; Take 1-2 capsules (100-200 mg total) by mouth 3 (three) times daily as needed for cough.  Dispense: 40 capsule; Refill: 0 - albuterol (PROVENTIL HFA) 108 (90 Base) MCG/ACT inhaler; Inhale 2 puffs into the lungs every 6 (six) hours as needed for wheezing or shortness of breath (coughing fit).  Dispense: 3.7 g; Refill: 0  2. Respiratory illness with fever Pt may return to work when he has been afebrile for 24 hrs without the use of a fever reducing medication. - ipratropium (ATROVENT) 0.03 % nasal spray; Place 2 sprays into both nostrils 2 (two) times daily.  Dispense: 30 mL; Refill: 0 - oseltamivir (TAMIFLU) 75 MG capsule; Take 1 capsule (75 mg total) by mouth 2 (two) times daily.  Dispense: 10 capsule;  Refill: 0 - Guaifenesin (MUCINEX MAXIMUM STRENGTH) 1200 MG TB12; Take 1 tablet (1,200 mg total) by mouth every 12 (twelve) hours as needed.  Dispense: 14 tablet; Refill: 1  Lorella Nimrod, PA-S

## 2017-11-26 ENCOUNTER — Encounter: Payer: Self-pay | Admitting: Family Medicine

## 2017-11-26 ENCOUNTER — Ambulatory Visit: Payer: BC Managed Care – PPO | Admitting: Family Medicine

## 2017-11-26 VITALS — BP 104/58 | HR 65 | Temp 97.5°F | Ht 67.0 in | Wt 163.2 lb

## 2017-11-26 DIAGNOSIS — M7581 Other shoulder lesions, right shoulder: Secondary | ICD-10-CM

## 2017-11-26 DIAGNOSIS — R7989 Other specified abnormal findings of blood chemistry: Secondary | ICD-10-CM

## 2017-11-26 DIAGNOSIS — K5180 Other ulcerative colitis without complications: Secondary | ICD-10-CM | POA: Diagnosis not present

## 2017-11-26 DIAGNOSIS — Z13220 Encounter for screening for lipoid disorders: Secondary | ICD-10-CM

## 2017-11-26 DIAGNOSIS — R7303 Prediabetes: Secondary | ICD-10-CM

## 2017-11-26 DIAGNOSIS — M7541 Impingement syndrome of right shoulder: Secondary | ICD-10-CM | POA: Diagnosis not present

## 2017-11-26 DIAGNOSIS — R945 Abnormal results of liver function studies: Secondary | ICD-10-CM | POA: Diagnosis not present

## 2017-11-26 LAB — CBC WITH DIFFERENTIAL/PLATELET
Basophils Absolute: 0 10*3/uL (ref 0.0–0.1)
Basophils Relative: 0.5 % (ref 0.0–3.0)
EOS ABS: 0.1 10*3/uL (ref 0.0–0.7)
Eosinophils Relative: 2.2 % (ref 0.0–5.0)
HEMATOCRIT: 46 % (ref 39.0–52.0)
HEMOGLOBIN: 15.2 g/dL (ref 13.0–17.0)
LYMPHS PCT: 26.2 % (ref 12.0–46.0)
Lymphs Abs: 1.4 10*3/uL (ref 0.7–4.0)
MCHC: 33.2 g/dL (ref 30.0–36.0)
MCV: 80.8 fl (ref 78.0–100.0)
MONO ABS: 0.4 10*3/uL (ref 0.1–1.0)
Monocytes Relative: 7.5 % (ref 3.0–12.0)
Neutro Abs: 3.4 10*3/uL (ref 1.4–7.7)
Neutrophils Relative %: 63.6 % (ref 43.0–77.0)
Platelets: 195 10*3/uL (ref 150.0–400.0)
RBC: 5.68 Mil/uL (ref 4.22–5.81)
RDW: 13.8 % (ref 11.5–15.5)
WBC: 5.4 10*3/uL (ref 4.0–10.5)

## 2017-11-26 LAB — HEPATIC FUNCTION PANEL
ALBUMIN: 4.1 g/dL (ref 3.5–5.2)
ALK PHOS: 102 U/L (ref 39–117)
ALT: 80 U/L — AB (ref 0–53)
AST: 46 U/L — ABNORMAL HIGH (ref 0–37)
BILIRUBIN TOTAL: 0.6 mg/dL (ref 0.2–1.2)
Bilirubin, Direct: 0.1 mg/dL (ref 0.0–0.3)
Total Protein: 7.6 g/dL (ref 6.0–8.3)

## 2017-11-26 LAB — BASIC METABOLIC PANEL
BUN: 11 mg/dL (ref 6–23)
CHLORIDE: 103 meq/L (ref 96–112)
CO2: 32 mEq/L (ref 19–32)
Calcium: 9.1 mg/dL (ref 8.4–10.5)
Creatinine, Ser: 0.96 mg/dL (ref 0.40–1.50)
GFR: 91.06 mL/min (ref >60.00)
GLUCOSE: 109 mg/dL — AB (ref 70–99)
Potassium: 4.1 mEq/L (ref 3.5–5.1)
SODIUM: 139 meq/L (ref 135–145)

## 2017-11-26 LAB — LIPID PANEL
CHOLESTEROL: 253 mg/dL — AB (ref 0–200)
HDL: 37.6 mg/dL — ABNORMAL LOW (ref >39.00)
LDL CALC: 187 mg/dL — AB (ref 0–99)
NONHDL: 215.22
Total CHOL/HDL Ratio: 7
Triglycerides: 139 mg/dL (ref 0.0–149.0)
VLDL: 27.8 mg/dL (ref 0.0–40.0)

## 2017-11-26 LAB — HEMOGLOBIN A1C: Hgb A1c MFr Bld: 5.7 % (ref 4.6–6.5)

## 2017-11-26 NOTE — Progress Notes (Signed)
Dr. Frederico Hamman T. Aalijah Lanphere, MD, [facility] [place], [address] Phone: [phone] Fax: [phone]  11/26/2017  Patient: Justin Barrera, MRN: [id number], DOB: 1976/01/16, 42 y.o.  Primary Physician:  Owens Loffler, MD   Subjective:   Justin Barrera is a 42 y.o. very pleasant male patient who presents with the following:  R shoulder pain: hurts with picking things up sometimes. Reaching back will hurt it. Some loss of motion.  Pleasant gentleman, and he has been noticing with his right shoulder some pain with abduction, primarily with terminal internal range of motion.  He particularly notices this when he is in his car and he is reaching behind him.  He is not had any specific injuries or trauma.  No prior injuries, dislocations, or fractures.  No history of significant shoulder surgery in the affected arm.  He also has not had any basic laboratory work in years, and he wanted to do that today also.  No prior injuries.   Diabetes?  Past Medical History, Surgical History, Social History, Family History, Problem List, Medications, and Allergies have been reviewed and updated if relevant.  Patient Active Problem List   Diagnosis Date Noted  . Cough variant asthma 03/16/2015  . Upper back strain 01/04/2014  . Whiplash injury to neck 01/04/2014  . HYPERCHOLESTEROLEMIA 04/04/2009  . COLITIS, ULCERATIVE 04/04/2009  . LIVER FUNCTION TESTS, ABNORMAL, HX OF 04/04/2009    Past Medical History:  Diagnosis Date  . Pure hypercholesterolemia   . Ulcerative colitis, unspecified     Past Surgical History:  Procedure Laterality Date  . VASECTOMY      Social History   Socioeconomic History  . Marital status: Married    Spouse name: Not on file  . Number of children: Not on file  . Years of education: Not on file  . Highest education level: Not on file  Occupational History  . Not on file  Social Needs  . Financial  resource strain: Not on file  . Food insecurity:    Worry: Not on file    Inability: Not on file  . Transportation needs:    Medical: Not on file    Non-medical: Not on file  Tobacco Use  . Smoking status: Former Smoker    Last attempt to quit: 09/01/1997    Years since quitting: 20.2  . Smokeless tobacco: Never Used  . Tobacco comment: 20 year ago  Substance and Sexual Activity  . Alcohol use: Yes    Comment: occassionally  . Drug use: No  . Sexual activity: Yes    Partners: Female  Lifestyle  . Physical activity:    Days per week: Not on file    Minutes per session: Not on file  . Stress: Not on file  Relationships  . Social connections:    Talks on phone: Not on file    Gets together: Not on file    Attends religious service: Not on file    Active member of club or organization: Not on file    Attends meetings of clubs or organizations: Not on file    Relationship status: Not on file  . Intimate partner violence:    Fear of current or ex partner: Not on file    Emotionally abused: Not on file    Physically abused: Not on file    Forced sexual activity: Not on file  Other Topics Concern  . Not on file  Social History  Narrative  . Not on file    Family History  Adopted: Yes    Allergies  Allergen Reactions  . Latex Anaphylaxis    Medication list reviewed and updated in full in [place].  GEN: No fevers, chills. Nontoxic. Primarily MSK c/o today. MSK: Detailed in the HPI GI: tolerating PO intake without difficulty Neuro: No numbness, parasthesias, or tingling associated. Otherwise the pertinent positives of the ROS are noted above.   Objective:   BP (!) 104/58 (BP Location: Left Arm, Patient Position: Sitting, Cuff Size: Normal)   Pulse 65   Temp (!) 97.5 F (36.4 C) (Oral)   Ht 5' 7"  (1.702 m)   Wt 163 lb 4 oz (74 kg)   SpO2 97%   BMI 25.57 kg/m    GEN: Well-developed,well-nourished,in no acute distress; alert,appropriate and cooperative  throughout examination HEENT: Normocephalic and atraumatic without obvious abnormalities. Ears, externally no deformities PULM: Breathing comfortably in no respiratory distress EXT: No clubbing, cyanosis, or edema PSYCH: Normally interactive. Cooperative during the interview. Pleasant. Friendly and conversant. Not anxious or depressed appearing. Normal, full affect.  Shoulder: R Inspection: No muscle wasting or winging Ecchymosis/edema: neg  AC joint, scapula, clavicle: NT Cervical spine: NT, full ROM Spurling's: neg Abduction: full, 5/5 Flexion: full, 5/5 IR, full, lift-off: 5/5 ER at neutral: full, 5/5 AC crossover: neg Neer: mild pos Hawkins: mild pos Drop Test: neg Empty Can: neg Supraspinatus insertion: mild-mod T Bicipital groove: NT Speed's: neg Yergason's: neg Sulcus sign: neg Scapular dyskinesis: none C5-T1 intact  Neuro: Sensation intact Grip 5/5   Radiology: Results for orders placed or performed in visit on 11/26/17  Hemoglobin A1c  Result Value Ref Range   Hgb A1c MFr Bld 5.7 4.6 - 6.5 %  Basic metabolic panel  Result Value Ref Range   Sodium 139 135 - 145 mEq/L   Potassium 4.1 3.5 - 5.1 mEq/L   Chloride 103 96 - 112 mEq/L   CO2 32 19 - 32 mEq/L   Glucose, Bld 109 (H) 70 - 99 mg/dL   BUN 11 6 - 23 mg/dL   Creatinine, Ser 0.96 0.40 - 1.50 mg/dL   Calcium 9.1 8.4 - 10.5 mg/dL   GFR 91.06 >60.00 mL/min  Hepatic function panel  Result Value Ref Range   Total Bilirubin 0.6 0.2 - 1.2 mg/dL   Bilirubin, Direct 0.1 0.0 - 0.3 mg/dL   Alkaline Phosphatase 102 39 - 117 U/L   AST 46 (H) 0 - 37 U/L   ALT 80 (H) 0 - 53 U/L   Total Protein 7.6 6.0 - 8.3 g/dL   Albumin 4.1 3.5 - 5.2 g/dL  Lipid panel  Result Value Ref Range   Cholesterol 253 (H) 0 - 200 mg/dL   Triglycerides 139.0 0.0 - 149.0 mg/dL   HDL 37.60 (L) >39.00 mg/dL   VLDL 27.8 0.0 - 40.0 mg/dL   LDL Cholesterol 187 (H) 0 - 99 mg/dL   Total CHOL/HDL Ratio 7    NonHDL 215.22   CBC with  Differential/Platelet  Result Value Ref Range   WBC 5.4 4.0 - 10.5 K/uL   RBC 5.68 4.22 - 5.81 Mil/uL   Hemoglobin 15.2 13.0 - 17.0 g/dL   HCT 46.0 39.0 - 52.0 %   MCV 80.8 78.0 - 100.0 fl   MCHC 33.2 30.0 - 36.0 g/dL   RDW 13.8 11.5 - 15.5 %   Platelets 195.0 150.0 - 400.0 K/uL   Neutrophils Relative % 63.6 43.0 - 77.0 %  Lymphocytes Relative 26.2 12.0 - 46.0 %   Monocytes Relative 7.5 3.0 - 12.0 %   Eosinophils Relative 2.2 0.0 - 5.0 %   Basophils Relative 0.5 0.0 - 3.0 %   Neutro Abs 3.4 1.4 - 7.7 K/uL   Lymphs Abs 1.4 0.7 - 4.0 K/uL   Monocytes Absolute 0.4 0.1 - 1.0 K/uL   Eosinophils Absolute 0.1 0.0 - 0.7 K/uL   Basophils Absolute 0.0 0.0 - 0.1 K/uL     Assessment and Plan:   Impingement syndrome of right shoulder region  Rotator cuff tendonitis, right  Prediabetes - Plan: Hemoglobin X8P, Basic metabolic panel  Screening, lipid - Plan: Lipid panel  Elevated LFTs - Plan: Hepatic function panel  Other ulcerative colitis without complication ([place]) - Plan: CBC with Differential/Platelet  I gave him a program of rotator cuff rehab and scapular stabilization.  For now this is all that he wants to do unless it gets worse.  We will also do a basic lab work on him, which is all reassuring.  Follow-up: No follow-ups on file.  Orders Placed This Encounter  Procedures  . Hemoglobin A1c  . Basic metabolic panel  . Hepatic function panel  . Lipid panel  . CBC with Differential/Platelet    Signed,  Frederico Hamman T. Destry Bezdek, MD   Allergies as of 11/26/2017      Reactions   Latex Anaphylaxis      Medication List        Accurate as of 11/26/17 11:59 PM. Always use your most recent med list.          albuterol 108 (90 Base) MCG/ACT inhaler Commonly known as:  PROVENTIL HFA Inhale 2 puffs into the lungs every 6 (six) hours as needed for wheezing or shortness of breath (coughing fit).   beclomethasone 80 MCG/ACT inhaler Commonly known as:  QVAR Inhale 1 puff into  the lungs 2 (two) times daily.

## 2017-11-30 ENCOUNTER — Encounter: Payer: Self-pay | Admitting: *Deleted

## 2017-12-23 ENCOUNTER — Other Ambulatory Visit: Payer: Self-pay | Admitting: Physician Assistant

## 2017-12-23 DIAGNOSIS — R05 Cough: Secondary | ICD-10-CM

## 2017-12-23 DIAGNOSIS — R059 Cough, unspecified: Secondary | ICD-10-CM

## 2017-12-24 NOTE — Telephone Encounter (Signed)
mychart message sent to pt about making an apt for refills.

## 2018-06-24 ENCOUNTER — Other Ambulatory Visit: Payer: Self-pay | Admitting: Family Medicine

## 2018-06-24 DIAGNOSIS — R05 Cough: Secondary | ICD-10-CM

## 2018-06-24 DIAGNOSIS — R059 Cough, unspecified: Secondary | ICD-10-CM

## 2018-06-24 NOTE — Telephone Encounter (Signed)
Copied from [place] [id number]. Topic: Quick Communication - Rx Refill/Question >> Jun 24, 2018  8:10 AM Margot Ables wrote: Medication: albuterol (PROVENTIL HFA) 108 (90 Base) MCG/ACT inhaler - previously ordered by [facility] - cold weather makes it worse and pt has a cold beclomethasone (QVAR) 80 MCG/ACT inhaler - previously from Dr. Lorelei Pont - pt uses PRN - cold weather makes it worse and pt has a cold  Has the patient contacted their pharmacy? Yes - RX expired Preferred Pharmacy (with phone number or street name): [id number] - [place], [place] - [address] [phone] (Phone) [phone] (Fax)

## 2018-06-24 NOTE — Telephone Encounter (Signed)
Rx refills for [facility]

## 2018-06-24 NOTE — Telephone Encounter (Signed)
Last office visit 11/26/2017 for shoulder pain.  No future appointments.  Ok to refill?

## 2018-06-25 MED ORDER — BECLOMETHASONE DIPROPIONATE 80 MCG/ACT IN AERS
1.0000 | INHALATION_SPRAY | Freq: Two times a day (BID) | RESPIRATORY_TRACT | 12 refills | Status: DC
Start: 2018-06-25 — End: 2019-12-07

## 2018-06-25 MED ORDER — ALBUTEROL SULFATE HFA 108 (90 BASE) MCG/ACT IN AERS
2.0000 | INHALATION_SPRAY | Freq: Four times a day (QID) | RESPIRATORY_TRACT | 2 refills | Status: DC | PRN
Start: 2018-06-25 — End: 2019-12-09

## 2019-10-31 DIAGNOSIS — U071 COVID-19: Secondary | ICD-10-CM

## 2019-10-31 HISTORY — DX: COVID-19: U07.1

## 2019-11-21 ENCOUNTER — Telehealth: Payer: Self-pay

## 2019-11-21 NOTE — Telephone Encounter (Signed)
Pt said on 11/19/19 pt started with chills, body aches, no fever, and has progressed with generalized weakness and weakness seems worse at both shoulders. Pt does not have chest pain but on and off when has difficulty breathing pt feels like a weight is on his chest; last time felt like wt on chest with difficulty breathing was earlier this morning. SOB on and off, no appetitie, S/T and fatigue; no other covid symptoms;pt did travel to [place] by car on 03-12/21 for 4 days.pt wore a mask but the people around him did not. No social distancing.  Pt is working in [place] from [employer] today and thinks his symptoms are allergy related; advised pt could be and also could be multiple symptoms of covid. Offered pt virtual appt but cannot due to work; offered appts at respiratory clinic but pt could not get there before clinic closes. Pt said he would plan on going to UC at [facility] on 11/22/19. UC & ED precautions given and pt voiced understanding. FYI to Dr Lorelei Pont.

## 2019-11-21 NOTE — Telephone Encounter (Signed)
Left message for Justin Barrera that per Dr. Lorelei Pont he needs to be isolating and getting tested for Covid.  I ask that he call us back with any questions.

## 2019-11-21 NOTE — Telephone Encounter (Signed)
He needs to isolate and get covid testing

## 2019-11-22 ENCOUNTER — Ambulatory Visit: Payer: BC Managed Care – PPO | Attending: Internal Medicine

## 2019-11-22 ENCOUNTER — Other Ambulatory Visit: Payer: Self-pay

## 2019-11-22 DIAGNOSIS — Z20822 Contact with and (suspected) exposure to covid-19: Secondary | ICD-10-CM

## 2019-11-23 LAB — SARS-COV-2, NAA 2 DAY TAT

## 2019-11-23 LAB — NOVEL CORONAVIRUS, NAA: SARS-CoV-2, NAA: DETECTED — AB

## 2019-11-24 ENCOUNTER — Encounter: Payer: Self-pay | Admitting: *Deleted

## 2019-12-07 ENCOUNTER — Encounter: Payer: Self-pay | Admitting: Family Medicine

## 2019-12-07 ENCOUNTER — Ambulatory Visit (INDEPENDENT_AMBULATORY_CARE_PROVIDER_SITE_OTHER): Payer: BC Managed Care – PPO | Admitting: Family Medicine

## 2019-12-07 VITALS — Temp 98.4°F

## 2019-12-07 DIAGNOSIS — U071 COVID-19: Secondary | ICD-10-CM | POA: Diagnosis not present

## 2019-12-07 DIAGNOSIS — J45991 Cough variant asthma: Secondary | ICD-10-CM

## 2019-12-07 MED ORDER — QVAR 80 MCG/ACT IN AERS: 1.0000 | INHALATION_SPRAY | Freq: Two times a day (BID) | RESPIRATORY_TRACT | 1 refills | Status: DC

## 2019-12-07 MED ORDER — HYDROCODONE-HOMATROPINE 5-1.5 MG/5ML PO SYRP: 5.0000 mL | ORAL_SOLUTION | Freq: Three times a day (TID) | ORAL | 0 refills | Status: DC | PRN

## 2019-12-07 MED ORDER — PREDNISONE 10 MG PO TABS: ORAL_TABLET | ORAL | 0 refills | Status: DC

## 2019-12-07 NOTE — Telephone Encounter (Signed)
Aware, I will see him then

## 2019-12-07 NOTE — Assessment & Plan Note (Signed)
Flared with covid-19  See A/P

## 2019-12-07 NOTE — Assessment & Plan Note (Signed)
Pt is over 10 days in from start of symptoms and overall improved but still coughing a lot in setting of cough variant asthma  Will try a course of prednisone (disc side eff)  Enc to use albuterol mdi prn Refilled the beclomethasone since he was out of it Px hycodan for cough with caution of sedation/habit Disc s/s of pneumonia to watch for  inst to call if worse or no improvement in the next 5 days or so  Continue rest and fluids  Can consider resp clinic if needed

## 2019-12-07 NOTE — Progress Notes (Signed)
Virtual Visit via Video Note  I connected with Justin Barrera on 12/07/19 at  3:00 PM EDT by a video enabled telemedicine application and verified that I am speaking with the correct person using two identifiers.  Location: Patient: home Provider: office   I discussed the limitations of evaluation and management by telemedicine and the availability of in person appointments. The patient expressed understanding and agreed to proceed.  Parties involved in encounter  Patient: Justin Barrera  Provider:  Loura Pardon MD   History of Present Illness: 44 yo pt of Dr Lorelei Pont here with covid having sob  He has a h/o cough variant asthma   Positive covid test on 3/23 Symptoms started 3 d prior to that   He did not have an antibody infusion    Has improved but still experiencing cough fits with exertion and also speech  Phlegm sometimes - clear to yellow  Not wheezing    Better when calm and resting  Sleeps propped up   No nasal symptoms  Taste /smell is coming back   He ran out of beclomethasone   Has albuterol mdi -has only used once   Was taking nyquil over the counter - that worked only for 2 days (had diarrhea with it)   Tried day quil also  Has h/o ulcerative colitis      Patient Active Problem List   Diagnosis Date Noted  . COVID-19 12/07/2019  . Cough variant asthma 03/16/2015  . HYPERCHOLESTEROLEMIA 04/04/2009  . Ulcerative colitis ([place]) 04/04/2009  . Elevated LFTs 04/04/2009   Past Medical History:  Diagnosis Date  . Pure hypercholesterolemia   . Ulcerative colitis, unspecified    Past Surgical History:  Procedure Laterality Date  . VASECTOMY     Social History   Tobacco Use  . Smoking status: Former Smoker    Quit date: 09/01/1997    Years since quitting: 22.2  . Smokeless tobacco: Never Used  . Tobacco comment: 20 year ago  Substance Use Topics  . Alcohol use: Yes    Comment: occassionally  . Drug use: No   Family History  Adopted: Yes    Allergies  Allergen Reactions  . Latex Anaphylaxis   Current Outpatient Medications on File Prior to Visit  Medication Sig Dispense Refill  . albuterol (PROVENTIL HFA) 108 (90 Base) MCG/ACT inhaler Inhale 2 puffs into the lungs every 6 (six) hours as needed for wheezing or shortness of breath (coughing fit). (Patient not taking: Reported on 12/07/2019) 3.7 g 2   No current facility-administered medications on file prior to visit.   Review of Systems  Constitutional: Positive for malaise/fatigue. Negative for chills and fever.  HENT: Negative for congestion, ear pain, sinus pain and sore throat.   Eyes: Negative for blurred vision, discharge and redness.  Respiratory: Positive for cough, sputum production and shortness of breath. Negative for wheezing and stridor.   Cardiovascular: Negative for chest pain, palpitations and leg swelling.  Gastrointestinal: Negative for abdominal pain, diarrhea, nausea and vomiting.  Musculoskeletal: Negative for myalgias.  Skin: Negative for rash.  Neurological: Negative for dizziness and headaches.    Observations/Objective: Patient appears well, in no distress Weight is baseline  No facial swelling or asymmetry Normal voice-not hoarse and no slurred speech No obvious tremor or mobility impairment Moving neck and UEs normally Able to hear the call well  Frequent dry sounding cough with speech/ no audible wheeze or sob  He is able to speak in full sentences  Talkative and mentally  sharp with no cognitive changes No skin changes on face or neck , no rash or pallor Affect is normal    Assessment and Plan: Problem List Items Addressed This Visit      Respiratory   Cough variant asthma - Primary    Flared with covid-19  See A/P       Relevant Medications   beclomethasone (QVAR) 80 MCG/ACT inhaler   predniSONE (DELTASONE) 10 MG tablet     Other   COVID-19    Pt is over 10 days in from start of symptoms and overall improved but still  coughing a lot in setting of cough variant asthma  Will try a course of prednisone (disc side eff)  Enc to use albuterol mdi prn Refilled the beclomethasone since he was out of it Px hycodan for cough with caution of sedation/habit Disc s/s of pneumonia to watch for  inst to call if worse or no improvement in the next 5 days or so  Continue rest and fluids  Can consider resp clinic if needed          Follow Up Instructions: Drink fluids and rest Take prednisone as directed for cough and chest tightness /shortness of breath  It may make you jittery/thirsty while you are on it  Use your albuterol inhaler as needed  Get back on your qvar regimen  Try hycodan for cough and be cautious of sedation with that   Please call if worse or any new symptoms Also if no improvement in the next several days    I discussed the assessment and treatment plan with the patient. The patient was provided an opportunity to ask questions and all were answered. The patient agreed with the plan and demonstrated an understanding of the instructions.   The patient was advised to call back or seek an in-person evaluation if the symptoms worsen or if the condition fails to improve as anticipated.     Loura Pardon, MD

## 2019-12-07 NOTE — Telephone Encounter (Signed)
Pt left v/m; pt did test positive for covid and pt still having difficulty breathing upon exertion. Pt was advised might need a vanco shot.

## 2019-12-07 NOTE — Patient Instructions (Addendum)
Drink fluids and rest Take prednisone as directed for cough and chest tightness /shortness of breath  It may make you jittery/thirsty while you are on it  Use your albuterol inhaler as needed  Get back on your qvar regimen  Try hycodan for cough and be cautious of sedation with that   Please call if worse or any new symptoms Also if no improvement in the next several days

## 2019-12-07 NOTE — Telephone Encounter (Addendum)
I spoke with pt; pt is still quarantining and pt tested positive for covid on 11/23/19. Pt said has hx of asthma and still has SOB and difficulty breathing with exertion. Pt was advised might need a Vanco shot. Pt said he is in no distress at this time and does not need to go to ED. Pt scheduled virtual visit 12/07/19 at 3 pm with Dr Glori Bickers. Pt will have vitals ready when CMA calls pt. UC & ED precautions given and pt voiced understanding. FYI to Dr Glori Bickers. (respiratory clinic appts are available at [facility] site today if needed).

## 2019-12-09 ENCOUNTER — Other Ambulatory Visit: Payer: Self-pay

## 2019-12-09 DIAGNOSIS — R059 Cough, unspecified: Secondary | ICD-10-CM

## 2019-12-09 DIAGNOSIS — R05 Cough: Secondary | ICD-10-CM

## 2019-12-09 MED ORDER — ALBUTEROL SULFATE HFA 108 (90 BASE) MCG/ACT IN AERS: 2.0000 | INHALATION_SPRAY | Freq: Four times a day (QID) | RESPIRATORY_TRACT | 2 refills | Status: DC | PRN

## 2019-12-09 NOTE — Telephone Encounter (Addendum)
Pt left v/m that virtual was done on 12/07/19 and pt thought had more albuterol than he actually does and pt request refill albuterol to [facility]. Pt request cb when refill done.Marland Kitchen

## 2019-12-16 ENCOUNTER — Telehealth (INDEPENDENT_AMBULATORY_CARE_PROVIDER_SITE_OTHER): Payer: BC Managed Care – PPO | Admitting: Family Medicine

## 2019-12-16 ENCOUNTER — Telehealth: Payer: Self-pay

## 2019-12-16 ENCOUNTER — Encounter: Payer: Self-pay | Admitting: Family Medicine

## 2019-12-16 VITALS — Temp 97.7°F

## 2019-12-16 DIAGNOSIS — J45991 Cough variant asthma: Secondary | ICD-10-CM

## 2019-12-16 DIAGNOSIS — R0602 Shortness of breath: Secondary | ICD-10-CM | POA: Diagnosis not present

## 2019-12-16 NOTE — Telephone Encounter (Signed)
Pt had virtual visit on 12/07/19; pt said still continues with SOB upon exertion and wt on chest that has had sinced dx with covid 11/23/19. Pt wants to know if should be concerned still having these symptoms; pt scheduled virtual with Dr Glori Bickers today at 12:15. UC & ED precautions given and pt voiced understanding. Pt can get T for vitals.

## 2019-12-16 NOTE — Assessment & Plan Note (Signed)
Cough post covid is improved  Still having some chest pressure /exercise intol

## 2019-12-16 NOTE — Progress Notes (Signed)
Virtual Visit via Video Note  I connected with Justin Barrera on 12/16/19 at 12:15 PM EDT by a video enabled telemedicine application and verified that I am speaking with the correct person using two identifiers.  Location: Patient: home Provider: office   I discussed the limitations of evaluation and management by telemedicine and the availability of in person appointments. The patient expressed understanding and agreed to proceed.  Parties involved in encounter  Patient: Justin Barrera  Provider:  Loura Pardon MD    History of Present Illness: Pt presents c/o sob  He was seen on 4/7 for cough variant asthma  He had just been through covid 19 infection (pos test 3/23) without ab infusion  At that time was improving all except for cough which was very hard to control We refilled his beclomethasone (was out of) Px hycodan for cough Also prednisone taper   Today he called to update that he still c/o sob on exertion since diag withcovid  Was concerned   He has come a long way overall  Cough comes when he exerts himself for the most part  Still has chest pressure  Sob with exertion (some cough also)  Cough is sometimes a little productive  Not really wheezing (unless it is at night)  Yesterday was first day back to work- and felt sob when sitting  Is also tired  Resting today  No nausea  Appetite -up with prednisone and not back down    Some brain fog/some trouble with concentration   (yesterday was slow thankfully)  This worries him more when he drives   No more fever  Taste/smell is mostly back   He gets a headache about once per day (not severe) for a few minutes  Has not taken anything for it  Drinking lots of fluids   Patient Active Problem List   Diagnosis Date Noted  . SOB (shortness of breath) 12/16/2019  . COVID-19 12/07/2019  . Cough variant asthma 03/16/2015  . HYPERCHOLESTEROLEMIA 04/04/2009  . Ulcerative colitis ([place]) 04/04/2009  . Elevated LFTs  04/04/2009   Past Medical History:  Diagnosis Date  . Pure hypercholesterolemia   . Ulcerative colitis, unspecified    Past Surgical History:  Procedure Laterality Date  . VASECTOMY     Social History   Tobacco Use  . Smoking status: Former Smoker    Quit date: 09/01/1997    Years since quitting: 22.3  . Smokeless tobacco: Never Used  . Tobacco comment: 20 year ago  Substance Use Topics  . Alcohol use: Yes    Comment: occassionally  . Drug use: No   Family History  Adopted: Yes   Allergies  Allergen Reactions  . Latex Anaphylaxis   Current Outpatient Medications on File Prior to Visit  Medication Sig Dispense Refill  . albuterol (PROVENTIL HFA) 108 (90 Base) MCG/ACT inhaler Inhale 2 puffs into the lungs every 6 (six) hours as needed for wheezing or shortness of breath (coughing fit). 18 g 2  . beclomethasone (QVAR) 80 MCG/ACT inhaler Inhale 1 puff into the lungs 2 (two) times daily. 1 Inhaler 1   No current facility-administered medications on file prior to visit.     Review of Systems  Constitutional: Positive for malaise/fatigue. Negative for chills and fever.  HENT: Negative for congestion, ear pain, sinus pain and sore throat.   Eyes: Negative for blurred vision, discharge and redness.  Respiratory: Positive for cough and sputum production. Negative for hemoptysis, shortness of breath, wheezing and stridor.  Cardiovascular: Negative for chest pain, palpitations and leg swelling.       Chest pressure  Gastrointestinal: Negative for abdominal pain, diarrhea, nausea and vomiting.  Musculoskeletal: Negative for myalgias.  Skin: Negative for rash.  Neurological: Positive for headaches. Negative for dizziness.    Observations/Objective: Patient appears well, in no distress Weight is baseline  No facial swelling or asymmetry Normal voice-not hoarse and no slurred speech No obvious tremor or mobility impairment Moving neck and UEs normally Able to hear the call  well  No cough or shortness of breath during interview (he does clear his throat) Good historian, takes a few seconds to compose thoughts   Very attentive  No skin changes on face or neck , no rash or pallor Affect is normal    Assessment and Plan: Problem List Items Addressed This Visit      Respiratory   Cough variant asthma    Cough post covid is improved  Still having some chest pressure /exercise intol         Other   SOB (shortness of breath) - Primary    Post covid-mostly on exertion /occ at rest with heavy feeling chest    (though cough has improved)  Fatigue and poor exercise intolerance  Also trouble concentrating  Has been back to work one day-will see after several more if we will have to do a part time schedule for a while (he is getting fmla paperwork)  Interested in the post covid clinic-will work on an appt for that so he can have in person exam and assessment           Follow Up Instructions: I'm glad you are slowly improving Our office will call you re: an appt. For the post covid clinic Take care of yourself and get extra rest Let us know how the weekend work shifts go re: stamina and breathing  If symptoms suddenly worsen/become severe please to to the ER.   I discussed the assessment and treatment plan with the patient. The patient was provided an opportunity to ask questions and all were answered. The patient agreed with the plan and demonstrated an understanding of the instructions.   The patient was advised to call back or seek an in-person evaluation if the symptoms worsen or if the condition fails to improve as anticipated.     Loura Pardon, MD

## 2019-12-16 NOTE — Telephone Encounter (Signed)
Aware, I will see him then

## 2019-12-16 NOTE — Patient Instructions (Signed)
I'm glad you are slowly improving Our office will call you re: an appt. For the post covid clinic Take care of yourself and get extra rest Let us know how the weekend work shifts go re: stamina and breathing  If symptoms suddenly worsen/become severe please to to the ER.

## 2019-12-16 NOTE — Assessment & Plan Note (Signed)
Post covid-mostly on exertion /occ at rest with heavy feeling chest    (though cough has improved)  Fatigue and poor exercise intolerance  Also trouble concentrating  Has been back to work one day-will see after several more if we will have to do a part time schedule for a while (he is getting fmla paperwork)  Interested in the post covid clinic-will work on an appt for that so he can have in person exam and assessment

## 2019-12-21 ENCOUNTER — Other Ambulatory Visit: Payer: Self-pay

## 2019-12-21 ENCOUNTER — Ambulatory Visit
Admission: RE | Admit: 2019-12-21 | Discharge: 2019-12-21 | Disposition: A | Discharge status: Home / Self Care | Payer: BC Managed Care – PPO | Source: Ambulatory Visit | Attending: Nurse Practitioner | Admitting: Nurse Practitioner

## 2019-12-21 ENCOUNTER — Ambulatory Visit (INDEPENDENT_AMBULATORY_CARE_PROVIDER_SITE_OTHER): Payer: BC Managed Care – PPO | Admitting: Nurse Practitioner

## 2019-12-21 VITALS — BP 98/74 | HR 88 | Temp 97.7°F | Ht 67.0 in | Wt 165.0 lb

## 2019-12-21 DIAGNOSIS — R42 Dizziness and giddiness: Secondary | ICD-10-CM | POA: Diagnosis not present

## 2019-12-21 DIAGNOSIS — R05 Cough: Secondary | ICD-10-CM

## 2019-12-21 DIAGNOSIS — Z8616 Personal history of COVID-19: Secondary | ICD-10-CM

## 2019-12-21 DIAGNOSIS — R059 Cough, unspecified: Secondary | ICD-10-CM

## 2019-12-21 DIAGNOSIS — F8081 Childhood onset fluency disorder: Secondary | ICD-10-CM

## 2019-12-21 DIAGNOSIS — R41840 Attention and concentration deficit: Secondary | ICD-10-CM

## 2019-12-21 DIAGNOSIS — R0602 Shortness of breath: Secondary | ICD-10-CM | POA: Diagnosis not present

## 2019-12-21 MED ORDER — BENZONATATE 100 MG PO CAPS: 100.0000 mg | ORAL_CAPSULE | Freq: Two times a day (BID) | ORAL | 0 refills | Status: DC | PRN

## 2019-12-21 MED ORDER — MECLIZINE HCL 12.5 MG PO TABS: 12.5000 mg | ORAL_TABLET | Freq: Three times a day (TID) | ORAL | 0 refills | Status: DC | PRN

## 2019-12-21 NOTE — Progress Notes (Signed)
@Patient  ID: Justin Barrera, male    DOB: [birth year], 44 y.o.   MRN: [id number]  Chief Complaint  Patient presents with  . Post COVID    Tested postive 11/22/19. Still have sx such as headaches, cough, fatigue, dizziness, numbness in hands and stated he has also started to stutter since having COVID.    Referring provider: Owens Loffler, MD   44 year old male with history of cough variant asthma, ulcerative colitis, hypercholesterolemia. Diagnosed with covid 11/22/19.   HPI  Patient presents today for post Covid care clinic visit.  Patient was diagnosed with Covid on 11/22/2019.  He has been seen by his primary care physician through televisit.  He has finished a round of prednisone last Thursday.  Patient states that he is still anxious that he is still experiencing cough and shortness of breath with exertion.  He states that he is experiencing brain fog and has noticed a slight stutter in his speech.  He denies any recent fever.  He has not had a chest x-ray since diagnosed with Covid.  Patient is on Qvar and Ventolin for cough variant asthma. Patient has also been experiencing some numbness and tingling in hands. Denies f/c/s, n/v/d, hemoptysis, PND, chest pain or edema.   Note: Patient walked in office today - O2 sats stayed above 98% and heart rate was stable    Allergies  Allergen Reactions  . Latex Anaphylaxis    Immunization History  Administered Date(s) Administered  . Influenza-Unspecified 06/17/2016, 05/02/2017  . Tdap 11/17/2011    Past Medical History:  Diagnosis Date  . Pure hypercholesterolemia   . Ulcerative colitis, unspecified     Tobacco History: Social History   Tobacco Use  Smoking Status Former Smoker  . Quit date: 09/01/1997  . Years since quitting: 22.3  Smokeless Tobacco Never Used  Tobacco Comment   20 year ago   Counseling given: Yes Comment: 20 year ago   Outpatient Encounter Medications as of 12/21/2019  Medication Sig  . albuterol  (PROVENTIL HFA) 108 (90 Base) MCG/ACT inhaler Inhale 2 puffs into the lungs every 6 (six) hours as needed for wheezing or shortness of breath (coughing fit).  . beclomethasone (QVAR) 80 MCG/ACT inhaler Inhale 1 puff into the lungs 2 (two) times daily.  . benzonatate (TESSALON) 100 MG capsule Take 1 capsule (100 mg total) by mouth 2 (two) times daily as needed for cough.  . meclizine (ANTIVERT) 12.5 MG tablet Take 1 tablet (12.5 mg total) by mouth 3 (three) times daily as needed for dizziness.   No facility-administered encounter medications on file as of 12/21/2019.     Review of Systems  Review of Systems  Constitutional: Positive for fatigue.  HENT: Negative.   Respiratory: Positive for cough and shortness of breath.   Cardiovascular: Negative.   Gastrointestinal: Negative.   Allergic/Immunologic: Negative.   Neurological: Positive for dizziness, numbness (bilat hands) and headaches.       Stutter in speech  Psychiatric/Behavioral: Positive for decreased concentration.       Physical Exam  BP 98/74 (BP Location: Left Arm, Patient Position: Sitting, Cuff Size: Small)   Pulse 88   Temp 97.7 F (36.5 C)   Ht 5' 7"  (1.702 m)   Wt 165 lb (74.8 kg)   SpO2 98%   BMI 25.84 kg/m   Wt Readings from Last 5 Encounters:  12/21/19 165 lb (74.8 kg)  11/26/17 163 lb 4 oz (74 kg)  10/09/16 159 lb (72.1 kg)  08/21/16 154  lb (69.9 kg)  03/16/15 151 lb (68.5 kg)     Physical Exam Vitals and nursing note reviewed.  Constitutional:      General: He is not in acute distress.    Appearance: He is well-developed.  Cardiovascular:     Rate and Rhythm: Normal rate and regular rhythm.  Pulmonary:     Effort: Pulmonary effort is normal. No respiratory distress.     Breath sounds: Normal breath sounds. No wheezing or rhonchi.  Musculoskeletal:     Right lower leg: No edema.     Left lower leg: No edema.  Skin:    General: Skin is warm and dry.  Neurological:     Mental Status: He is  alert and oriented to person, place, and time.     Comments: Equal grips bilat  Psychiatric:        Mood and Affect: Mood normal.        Behavior: Behavior normal.        Assessment & Plan:   History of COVID-19 Dizziness Shortness of breath Cough:  Will order chest x ray  Will order labs  Encouraged deep breathing exercises  Continue Qvar  Continue Albuterol  May start Flonase  May start trial of Prilosec  Patient walked in office today - O2 sats stayed above 98% and heart rate was stable  Stay active  Will order tessalon perles  Will order meclizine for vertigo  Brain fog Speech impediment:  Will give some more time - if not improving will refer to neurology  Intermittent chest pain:  EKG: NSR with occasional PVC's   Follow up:  Follow up in 1 month or sooner if needed     Fenton Foy, NP 12/22/2019

## 2019-12-21 NOTE — Patient Instructions (Addendum)
Dizziness Shortness of breath Cough:  Will order chest x ray  Will order labs  Encouraged deep breathing exercises  Continue Qvar  Continue Albuterol  May start Flonase  May start trial of Prilosec  Patient walked in office today - O2 sats stayed above 98% and heart rate was stable  Stay active  Will order tessalon perles  Will order meclizine for vertigo  Brain fog Speech impediment:  Will give some more time - if not improving will refer to neurology  Intermittent chest pain:  EKG: NSR with occasional PVC's   Follow up:  Follow up in 1 month or sooner if needed

## 2019-12-22 ENCOUNTER — Telehealth: Payer: Self-pay

## 2019-12-22 ENCOUNTER — Other Ambulatory Visit: Payer: Self-pay | Admitting: Nurse Practitioner

## 2019-12-22 DIAGNOSIS — F8081 Childhood onset fluency disorder: Secondary | ICD-10-CM | POA: Insufficient documentation

## 2019-12-22 DIAGNOSIS — R059 Cough, unspecified: Secondary | ICD-10-CM | POA: Insufficient documentation

## 2019-12-22 DIAGNOSIS — R7989 Other specified abnormal findings of blood chemistry: Secondary | ICD-10-CM

## 2019-12-22 DIAGNOSIS — R05 Cough: Secondary | ICD-10-CM | POA: Insufficient documentation

## 2019-12-22 DIAGNOSIS — R0602 Shortness of breath: Secondary | ICD-10-CM

## 2019-12-22 DIAGNOSIS — Z8616 Personal history of COVID-19: Secondary | ICD-10-CM | POA: Insufficient documentation

## 2019-12-22 DIAGNOSIS — R41840 Attention and concentration deficit: Secondary | ICD-10-CM | POA: Insufficient documentation

## 2019-12-22 DIAGNOSIS — R42 Dizziness and giddiness: Secondary | ICD-10-CM | POA: Insufficient documentation

## 2019-12-22 LAB — COMPREHENSIVE METABOLIC PANEL
ALT: 89 IU/L — ABNORMAL HIGH (ref 0–44)
AST: 41 IU/L — ABNORMAL HIGH (ref 0–40)
Albumin/Globulin Ratio: 1.3 (ref 1.2–2.2)
Albumin: 4.1 g/dL (ref 4.0–5.0)
Alkaline Phosphatase: 151 IU/L — ABNORMAL HIGH (ref 39–117)
BUN/Creatinine Ratio: 9 (ref 9–20)
BUN: 7 mg/dL (ref 6–24)
Bilirubin Total: 0.3 mg/dL (ref 0.0–1.2)
CO2: 25 mmol/L (ref 20–29)
Calcium: 9.4 mg/dL (ref 8.7–10.2)
Chloride: 102 mmol/L (ref 96–106)
Creatinine, Ser: 0.78 mg/dL (ref 0.76–1.27)
GFR calc Af Amer: 127 mL/min/{1.73_m2} (ref >59)
GFR calc non Af Amer: 110 mL/min/{1.73_m2} (ref >59)
Globulin, Total: 3.1 g/dL (ref 1.5–4.5)
Glucose: 88 mg/dL (ref 65–99)
Potassium: 4.4 mmol/L (ref 3.5–5.2)
Sodium: 139 mmol/L (ref 134–144)
Total Protein: 7.2 g/dL (ref 6.0–8.5)

## 2019-12-22 LAB — CBC WITH DIFFERENTIAL/PLATELET
Basophils Absolute: 0.1 10*3/uL (ref 0.0–0.2)
Basos: 1 %
EOS (ABSOLUTE): 0.2 10*3/uL (ref 0.0–0.4)
Eos: 2 %
Hematocrit: 40.8 % (ref 37.5–51.0)
Hemoglobin: 14.1 g/dL (ref 13.0–17.7)
Immature Grans (Abs): 0.4 10*3/uL — ABNORMAL HIGH (ref 0.0–0.1)
Immature Granulocytes: 4 %
Lymphocytes Absolute: 2.2 10*3/uL (ref 0.7–3.1)
Lymphs: 22 %
MCH: 29 pg (ref 26.6–33.0)
MCHC: 34.6 g/dL (ref 31.5–35.7)
MCV: 84 fL (ref 79–97)
Monocytes Absolute: 1 10*3/uL — ABNORMAL HIGH (ref 0.1–0.9)
Monocytes: 10 %
Neutrophils Absolute: 6 10*3/uL (ref 1.4–7.0)
Neutrophils: 61 %
Platelets: 252 10*3/uL (ref 150–450)
RBC: 4.86 x10E6/uL (ref 4.14–5.80)
RDW: 14.4 % (ref 11.6–15.4)
WBC: 9.8 10*3/uL (ref 3.4–10.8)

## 2019-12-22 LAB — D-DIMER, QUANTITATIVE: D-DIMER: 1.33 mg/L FEU — ABNORMAL HIGH (ref 0.00–0.49)

## 2019-12-22 MED ORDER — AZITHROMYCIN 250 MG PO TABS: ORAL_TABLET | ORAL | 0 refills | Status: DC

## 2019-12-22 MED ORDER — AMOXICILLIN-POT CLAVULANATE 875-125 MG PO TABS: 1.0000 | ORAL_TABLET | Freq: Two times a day (BID) | ORAL | 0 refills | Status: DC

## 2019-12-22 MED ORDER — DOXYCYCLINE HYCLATE 100 MG PO TABS: 100.0000 mg | ORAL_TABLET | Freq: Two times a day (BID) | ORAL | 0 refills | Status: DC

## 2019-12-22 NOTE — Progress Notes (Signed)
Patient notified pf results, verbally understood. Appointment made for May 21.

## 2019-12-22 NOTE — Addendum Note (Signed)
Addended by: Fenton Foy on: 12/22/2019 08:15 AM   Modules accepted: Orders

## 2019-12-22 NOTE — Progress Notes (Signed)
Call went straight to voicemail, left message to call back

## 2019-12-22 NOTE — Telephone Encounter (Signed)
Left detailed message for Tyee with below information from Dr. Lorelei Pont.  I ask that he call us back if he has any questions.

## 2019-12-22 NOTE — Telephone Encounter (Signed)
I looked at his chest x-ray results and it does look like he has bilateral pneumonia.  He really should not be at work, and he should go home and take his antibiotics and rest and do other supportive measures until he starts to feel better.  If he acutely decompensates and get worse please let us know.  If he is not significantly better by Monday, then he needs to have a repeat follow-up in the office.

## 2019-12-22 NOTE — Assessment & Plan Note (Signed)
Dizziness Shortness of breath Cough:  Will order chest x ray  Will order labs  Encouraged deep breathing exercises  Continue Qvar  Continue Albuterol  May start Flonase  May start trial of Prilosec  Patient walked in office today - O2 sats stayed above 98% and heart rate was stable  Stay active  Will order tessalon perles  Will order meclizine for vertigo  Brain fog Speech impediment:  Will give some more time - if not improving will refer to neurology  Intermittent chest pain:  EKG: NSR with occasional PVC's   Follow up:  Follow up in 1 month or sooner if needed

## 2019-12-22 NOTE — Progress Notes (Signed)
Left voicemail to call back, will put in CTA order now.

## 2019-12-22 NOTE — Progress Notes (Signed)
Lab results came back with elevated D-dimer.

## 2019-12-22 NOTE — Telephone Encounter (Signed)
Patient l/m stating that he was advised that he has Pneumonia as of this morning per post covid clinic. He was seen at post covid clinic yesterday-12/21/19. Patient is at work this morning that is about an hour away from his home, he made it to work. He has to work 10 to 12 hours for his shift. He wanted to make sure that he was not over doing it with been at work for that long and any other suggestions? CB is [phone].

## 2019-12-22 NOTE — Progress Notes (Signed)
Results given to patient, Verbally understood. No additional questions Given information about [facility] for CTA

## 2019-12-23 ENCOUNTER — Telehealth: Payer: Self-pay | Admitting: Nurse Practitioner

## 2019-12-23 ENCOUNTER — Other Ambulatory Visit: Payer: Self-pay

## 2019-12-23 ENCOUNTER — Ambulatory Visit: Admission: RE | Admit: 2019-12-23 | Payer: BC Managed Care – PPO | Source: Ambulatory Visit

## 2019-12-23 NOTE — Telephone Encounter (Signed)
Per [facility] they are unable to perform the CTA because last xray is showing active COVID Pneumonia.   Per Kenney Houseman have patient finish course of antibiotics that was prescribed and watch symptoms closely this weekend. If any shortness of breath or feeling worse advised patient to go to Emergency Room.   Patient verbalized agreement and was understandable fo the situation.

## 2019-12-23 NOTE — Telephone Encounter (Signed)
Pt's girlfriend left a VM on Triage line asking what they should do about the situation of the CT Scan. Spoke to pt. I told him that the only way I knew for him to get it done would be through the ER. So, I advised him if his symptoms worsen, go to the ER and they should be able to do the CTA there.

## 2019-12-26 NOTE — Telephone Encounter (Signed)
Justin Barrera, I haven't seen him in 2 years and that was for shoulder pain.  I won't be able to answer his questions. I will involve the evaluating provider.  Cc: [name]

## 2019-12-26 NOTE — Telephone Encounter (Signed)
Spoke with patient, he stated he is feeling better has some shortness of breath with exertion. Again advised him to go to ER if he feels any worse. He does not have an appointment with [place] until 5/21.He is on day 4 of his antibiotics he is to be out of work until he finishes his course and feeling better. Patient verbally agreed and understood.

## 2019-12-26 NOTE — Telephone Encounter (Signed)
Appropriate. Possibly a VQ scan could be done.  Not outpatient.

## 2019-12-26 NOTE — Telephone Encounter (Signed)
Pt left v/m; he received call last wk from nurse and pt wants to know how long he should be out of work; today is day 4 of abx. Pt also wants clarification on why needs CT scan and what type CT scan needed after CXR.

## 2019-12-26 NOTE — Telephone Encounter (Signed)
Please call patient to answer his questions. Not sure why he keeps calling PCP office when they did not see him. If his shortness of breath is worse he will need to go to the ED for possible CTA or VQ scan. Outpatient radiology [facility]) refuses to do the scan because his chest xray showed Covid Pneumonia.

## 2020-01-12 ENCOUNTER — Telehealth: Payer: Self-pay | Admitting: Nurse Practitioner

## 2020-01-12 NOTE — Telephone Encounter (Signed)
Patient called office requesting a new order for the CT scan that was previously ordered. Told patient per Verbal by Lazaro Arms, NP that the scan was canceled by radiology due to him having COVID Pneumonia. Advised patient that if he is having active chest pain he should go to nearest ED and be revaluated to see if he is in need of CT reorder.

## 2020-01-12 NOTE — Telephone Encounter (Signed)
Agreed.  Thanks.  

## 2020-01-20 ENCOUNTER — Ambulatory Visit: Payer: BC Managed Care – PPO | Admitting: Nurse Practitioner

## 2020-01-20 ENCOUNTER — Other Ambulatory Visit: Payer: Self-pay

## 2020-01-20 ENCOUNTER — Ambulatory Visit
Admission: RE | Admit: 2020-01-20 | Discharge: 2020-01-20 | Disposition: A | Discharge status: Home / Self Care | Payer: BC Managed Care – PPO | Source: Ambulatory Visit | Attending: Nurse Practitioner | Admitting: Nurse Practitioner

## 2020-01-20 VITALS — BP 100/78 | HR 94 | Temp 97.5°F | Wt 164.5 lb

## 2020-01-20 DIAGNOSIS — R7989 Other specified abnormal findings of blood chemistry: Secondary | ICD-10-CM | POA: Diagnosis not present

## 2020-01-20 DIAGNOSIS — Z8616 Personal history of COVID-19: Secondary | ICD-10-CM

## 2020-01-20 DIAGNOSIS — R748 Abnormal levels of other serum enzymes: Secondary | ICD-10-CM

## 2020-01-20 DIAGNOSIS — U071 COVID-19: Secondary | ICD-10-CM

## 2020-01-20 DIAGNOSIS — J1282 Pneumonia due to coronavirus disease 2019: Secondary | ICD-10-CM

## 2020-01-20 NOTE — Progress Notes (Signed)
@Patient  ID: Justin Barrera, male    DOB: April 09, 1976, 44 y.o.   MRN: [id number]  Chief Complaint  Patient presents with  . Follow-up    Still having some chest discomfort, fatigue brain fog is not as bad.     Referring provider: Owens Loffler, MD   44 year old male with history of cough variant asthma, ulcerative colitis, hypercholesterolemia. Diagnosed with covid 11/22/19.   HPI  Patient presents today for post Covid care clinic follow-up.  In his last visit his chest x-ray did show bilateral Covid pneumonia.  Patient was treated with Augmentin and azithromycin.  He had also been given a course of prednisone by PCP.  Patient is on Qvar and Ventolin for cough variant asthma.  Patient states that he is feeling much improved.  He has returned to work.  He is staying active.  He denies any significant cough.  Lab work at last visit showed elevated liver enzymes and elevated D-dimer.  Imaging center refused to do a CTA as ordered due to active Covid pneumonia.  Patient states that shortness of breath is much improved.  We will recheck labs and chest x-ray today.  Denies f/c/s, n/v/d, hemoptysis, PND, chest pain or edema.       Allergies  Allergen Reactions  . Latex Anaphylaxis    Immunization History  Administered Date(s) Administered  . Influenza-Unspecified 06/17/2016, 05/02/2017  . Tdap 11/17/2011    Past Medical History:  Diagnosis Date  . Pure hypercholesterolemia   . Ulcerative colitis, unspecified     Tobacco History: Social History   Tobacco Use  Smoking Status Former Smoker  . Quit date: 09/01/1997  . Years since quitting: 22.4  Smokeless Tobacco Never Used  Tobacco Comment   20 year ago   Counseling given: Yes Comment: 20 year ago   Outpatient Encounter Medications as of 01/20/2020  Medication Sig  . albuterol (PROVENTIL HFA) 108 (90 Base) MCG/ACT inhaler Inhale 2 puffs into the lungs every 6 (six) hours as needed for wheezing or shortness of breath  (coughing fit).  . beclomethasone (QVAR) 80 MCG/ACT inhaler Inhale 1 puff into the lungs 2 (two) times daily.  . benzonatate (TESSALON) 100 MG capsule Take 1 capsule (100 mg total) by mouth 2 (two) times daily as needed for cough. (Patient not taking: Reported on 01/20/2020)  . meclizine (ANTIVERT) 12.5 MG tablet Take 1 tablet (12.5 mg total) by mouth 3 (three) times daily as needed for dizziness. (Patient not taking: Reported on 01/20/2020)  . [DISCONTINUED] amoxicillin-clavulanate (AUGMENTIN) 875-125 MG tablet Take 1 tablet by mouth 2 (two) times daily.  . [DISCONTINUED] azithromycin (ZITHROMAX) 250 MG tablet Take 2 tablets (500 mg) on day 1, then take 1 tablet (250 mg) on days 2-5   No facility-administered encounter medications on file as of 01/20/2020.     Review of Systems  Review of Systems  Constitutional: Negative.  Negative for chills and fever.  HENT: Negative.   Respiratory: Negative for cough and shortness of breath.   Cardiovascular: Negative.   Gastrointestinal: Negative.   Allergic/Immunologic: Negative.   Neurological: Negative.   Psychiatric/Behavioral: Negative.        Physical Exam  BP 100/78 (BP Location: Left Arm, Patient Position: Sitting, Cuff Size: Small)   Pulse 94   Temp (!) 97.5 F (36.4 C)   Wt 164 lb 8 oz (74.6 kg)   SpO2 98%   BMI 25.76 kg/m   Wt Readings from Last 5 Encounters:  01/20/20 164 lb 8 oz (  74.6 kg)  12/21/19 165 lb (74.8 kg)  11/26/17 163 lb 4 oz (74 kg)  10/09/16 159 lb (72.1 kg)  08/21/16 154 lb (69.9 kg)     Physical Exam Vitals and nursing note reviewed.  Constitutional:      General: He is not in acute distress.    Appearance: He is well-developed.  Cardiovascular:     Rate and Rhythm: Normal rate and regular rhythm.  Pulmonary:     Effort: Pulmonary effort is normal.     Breath sounds: Normal breath sounds.  Skin:    General: Skin is warm and dry.  Neurological:     Mental Status: He is alert and oriented to  person, place, and time.       Imaging: DG Chest 2 View  Result Date: 12/22/2019 CLINICAL DATA:  44 year old male with history of COVID-19 infection. Cough. EXAM: CHEST - 2 VIEW COMPARISON:  Chest x-ray 03/16/2015. FINDINGS: Patchy ill-defined opacities and areas of interstitial prominence are noted throughout the mid to lower lungs bilaterally. No confluent consolidative airspace disease. No pleural effusions. No evidence of pulmonary edema. Heart size is normal. Upper mediastinal contours are within normal limits. IMPRESSION: 1. The appearance the chest is compatible with mild multilobar bilateral pneumonia as can be seen in the setting of reported COVID-19 infection. Electronically Signed   By: Vinnie Langton M.D.   On: 12/22/2019 07:59     Assessment & Plan:   History of COVID-19 Covid pneumonia:  Glad you are doing better!  Will recheck chest x ray today  Continue deep breathing exercises  Stay active  Stay well hydrated   Elevated Liver enzymes:  Noted on last lab work - will recheck today   Follow up:   Follow up in as needed      Fenton Foy, NP 01/20/2020

## 2020-01-20 NOTE — Patient Instructions (Signed)
Covid pneumonia:  Glad you are doing better!  Will recheck chest x ray today  Continue deep breathing exercises  Stay active  Stay well hydrated   Elevated Liver enzymes:  Noted on last lab work - will recheck today   Follow up:   Follow up in as needed

## 2020-01-20 NOTE — Assessment & Plan Note (Signed)
Covid pneumonia:  Glad you are doing better!  Will recheck chest x ray today  Continue deep breathing exercises  Stay active  Stay well hydrated   Elevated Liver enzymes:  Noted on last lab work - will recheck today   Follow up:   Follow up in as needed

## 2020-01-25 NOTE — Progress Notes (Signed)
Patient notified of results, verbally understood. Appointment scheduled for 7/22 @10am 

## 2020-02-03 ENCOUNTER — Telehealth: Payer: Self-pay | Admitting: Nurse Practitioner

## 2020-02-03 DIAGNOSIS — R748 Abnormal levels of other serum enzymes: Secondary | ICD-10-CM

## 2020-02-03 NOTE — Telephone Encounter (Signed)
Per verbal by Lazaro Arms, NP patient liver enzymes came back elevated and requested he be referred to GI.  Spoke with patient regarding results and the referral. Patient verbally agreed and understood.

## 2020-02-10 ENCOUNTER — Encounter: Payer: Self-pay | Admitting: Nurse Practitioner

## 2020-03-13 NOTE — Progress Notes (Signed)
03/14/2020 Justin Barrera [id number] [birth year]   CHIEF COMPLAINT: Elevated LFTs, colitis symptoms   HISTORY OF PRESENT ILLNESS: Justin Barrera is a 44 year old male with a past medical history of hypercholesterolemia and ulcerative colitis initially diagnosed in 2008.  He was diagnosed with Covid 19 pneumonia 11/22/2019 with prolonged asthma exacerbation  treated with Prednsione, Augmentin and Azithromycin. Labs done March and April 2021 showed mildly elevated LFTs, AST 46 -41. ALT 80 89.  He was referred to our office by his Felipa Emory NP for further evaluation for elevated LFTs and for increased colitis symptoms. He reports having more right and left mid abdominal pain and nonbloody diarrhea since late April or early May 2021.  He describes having urgent nonbloody brown diarrhea 2-5 times daily approximately 2 to 3 days weekly. He can go 1 or 2 weeks without having any diarrhea.  He sometimes passes mucus per the rectum.  No bloody mucous.  He is able to pass a solid brown stool twice daily 3 to 4 days weekly. He has dull intermittent right mid to right lower abdominal pain and left mid abdominal pain which comes and goes. His vapes THC which relieves his abdominal pain and controls his anxiety. He typically vapes 9 times daily. He can go several days without experiencing any abdominal pain. He has gained 4lbs in the past few months.  No fever, sweats or chills. No NSAID use. He developed central mid chest pain post Covid 19 infection. He describes having sharp or full mid chest pain once every 2 weeks which typically occurs while at work when sitting. No associated SOB or dizziness. He had brief palpitations during one episodes a few weeks ago. He reported having a normal EKG done by the Covid clinic. His chest pain does not occur when eating. No N/V. No heartburn or stomach pain.   He was initially diagnosed with Ulcerative Colitis in 2008. He initially presented with bloody diarrhea. He  underwent a colonoscopy by Dr. Benson Norway 10/27/2006 which identified active mild colitis. I reviewed the biopsy results which confirmed mild active colitis which was nonspecific and classic features of IBD were not identified, the differential included self limited colitis verses NSAID use and less likely IBD. He was prescribed Lialda for which he took for 3 months then stopped it as his bloody diarrhea abated. He took Lialda briefly in 2011 when he had recurrence of diarrhea.  He stated his LFTs were elevated 10/2006. Labs 10/12/2006: Alk phos 119. T. Bili 0.4. AST 41. ALT 87. He also noted eating a lot of junk food then. He changed his diet "dramatically" and his LFTs normalized. He was last seen in office by Dr. Collene Mares in 2013. At that time, his colitis symptoms were stable. He reported having an occasional lose stool and his LFTs were normal. Labs 09/12/2011: T. Bili 0.4. Alk phos 117. AST 12. ALT 26. He was advised to restart Lialda 1.2gm po bid (samples given) and to repeat a colonoscopy at the age of 29.   He reported having prolonged sedation following a dental procedure in his late teens at following his colonoscopy in 2008.     CMP Latest Ref Rng & Units 12/21/2019 11/26/2017 11/16/2009  Glucose 65 - 99 mg/dL 88 109(H) 90  BUN 6 - 24 mg/dL 7 11 8   Creatinine 0.76 - 1.27 mg/dL 0.78 0.96 0.8  Sodium 134 - 144 mmol/L 139 139 140  Potassium 3.5 - 5.2 mmol/L 4.4 4.1 4.1  Chloride 96 -  106 mmol/L 102 103 104  CO2 20 - 29 mmol/L 25 32 32  Calcium 8.7 - 10.2 mg/dL 9.4 9.1 9.6  Total Protein 6.0 - 8.5 g/dL 7.2 7.6 8.2  Total Bilirubin 0.0 - 1.2 mg/dL 0.3 0.6 0.8  Alkaline Phos 39 - 117 IU/L 151(H) 102 106  AST 0 - 40 IU/L 41(H) 46(H) 32  ALT 0 - 44 IU/L 89(H) 80(H) 46    CBC Latest Ref Rng & Units 12/21/2019 11/26/2017 11/16/2009  WBC 3.4 - 10.8 x10E3/uL 9.8 5.4 5.7  Hemoglobin 13.0 - 17.7 g/dL 14.1 15.2 15.5  Hematocrit 37.5 - 51.0 % 40.8 46.0 47.4  Platelets 150 - 450 x10E3/uL 252 195.0 209.0      Past Medical History:  Diagnosis Date   Anxiety    Asthma    COVID-19 10/2019   Elevated LFTs    Pure hypercholesterolemia    Ulcerative colitis, unspecified    Vertigo    Past Surgical History:  Procedure Laterality Date   MOUTH SURGERY     VASECTOMY     Social History: He is divorced. He has one daughter and one son. 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 No alcohol. Vapes THC daily. No other drug use.   Family History:  Family history unknown, patient was adopted.   Allergies  Allergen Reactions   Latex Anaphylaxis      Outpatient Encounter Medications as of 03/14/2020  Medication Sig   albuterol (PROVENTIL HFA) 108 (90 Base) MCG/ACT inhaler Inhale 2 puffs into the lungs every 6 (six) hours as needed for wheezing or shortness of breath (coughing fit).   beclomethasone (QVAR) 80 MCG/ACT inhaler Inhale 1 puff into the lungs 2 (two) times daily.   benzonatate (TESSALON) 100 MG capsule Take 1 capsule (100 mg total) by mouth 2 (two) times daily as needed for cough. (Patient not taking: Reported on 01/20/2020)   meclizine (ANTIVERT) 12.5 MG tablet Take 1 tablet (12.5 mg total) by mouth 3 (three) times daily as needed for dizziness. (Patient not taking: Reported on 01/20/2020)   No facility-administered encounter medications on file as of 03/14/2020.     REVIEW OF SYSTEMS:   Gen: Denies fever, sweats or chills. No weight loss.  CV: See HPI. Resp: Denies cough, shortness of breath of hemoptysis.  GI: See HPI. GU : Denies urinary burning, blood in urine, increased urinary frequency or incontinence. MS: Denies joint pain, muscles aches or weakness. Derm: Denies rash, itchiness, skin lesions or unhealing ulcers. Psych: + anxiety.  Heme: Denies bruising, bleeding. Neuro:  Denies headaches, dizziness or paresthesias. Endo:  Denies any problems with DM, thyroid or adrenal function.    PHYSICAL EXAM: BP 90/68 (BP Location: Left Arm,  Patient Position: Sitting, Cuff Size: Normal)    Pulse 64    Ht 5' 7"  (1.702 m) Comment: height measured without shoes   Wt 168 lb 6 oz (76.4 kg)    BMI 26.37 kg/m  General: Well developed  44 year old male in no acute distress. Head: Normocephalic and atraumatic. Eyes:  Sclerae non-icteric, conjunctive pink. Ears: Normal auditory acuity. Mouth: Missing dentition. Few remaining teeth intact. No ulcers or lesions.  Neck: Supple, no lymphadenopathy or thyromegaly.  Lungs: Clear bilaterally to auscultation without wheezes, crackles or rhonchi. Heart: Regular rate and rhythm. No murmur, rub or gallop appreciated.  Abdomen: Soft, nontender, non distended. No masses. No hepatosplenomegaly. Normoactive bowel sounds x 4 quadrants.  Rectal: Deferred.  Musculoskeletal: Symmetrical with no gross  deformities. Skin: Warm and dry. No rash or lesions on visible extremities. Numerous tattoos.  Extremities: No edema. Neurological: Alert oriented x 4, no focal deficits.  Psychological:  Alert and cooperative. Normal mood and affect.  ASSESSMENT AND PLAN:  71. 44 year old male with elevated Alk phos and LFTs March - April 2021, may be due to Covid 19 infection. -Hepatic panel, AMA, SMA , ANA, IgG, Hep A total ab, Hep B surface ab, Hep B core total, Hep C ab, A1AT, tTG, IgA and ceruloplasmin.  -Await CTAP results. May require MRCP if alk phos levels remain elevated.  2. History of mild active colitis diagnosed by colonoscopy 10/2006, colitis symptoms resolved after patient took Lialda x 3 months. Right and left intermittent abdominal pain. Intermittent nonbloody diarrhea. -CTAP with oral and IV contrast -Colonoscopy benefits and risks discussed including risk with sedation, risk of bleeding, perforation and infection  -If diarrhea occurs daily will check GI pathogen and C. Diff PCR  3. S/P Covid 19 pneumonia 10/2019  4. Chest pain etiology:cardiac vs anxiety vs biliary/GERD (less likely) -Patient to follow  up with PCP for further evaluation prior to proceeding with colonoscopy  -Await CTAP and the above lab results. No plans for EGD at this time.      CC:  Owens Loffler, MD

## 2020-03-14 ENCOUNTER — Encounter: Payer: Self-pay | Admitting: Nurse Practitioner

## 2020-03-14 ENCOUNTER — Other Ambulatory Visit (INDEPENDENT_AMBULATORY_CARE_PROVIDER_SITE_OTHER): Payer: BC Managed Care – PPO

## 2020-03-14 ENCOUNTER — Encounter: Payer: Self-pay | Admitting: General Surgery

## 2020-03-14 ENCOUNTER — Ambulatory Visit: Payer: BC Managed Care – PPO | Admitting: Nurse Practitioner

## 2020-03-14 VITALS — BP 90/68 | HR 64 | Ht 67.0 in | Wt 168.4 lb

## 2020-03-14 DIAGNOSIS — R7989 Other specified abnormal findings of blood chemistry: Secondary | ICD-10-CM

## 2020-03-14 DIAGNOSIS — K51919 Ulcerative colitis, unspecified with unspecified complications: Secondary | ICD-10-CM

## 2020-03-14 DIAGNOSIS — R197 Diarrhea, unspecified: Secondary | ICD-10-CM | POA: Diagnosis not present

## 2020-03-14 DIAGNOSIS — R1031 Right lower quadrant pain: Secondary | ICD-10-CM

## 2020-03-14 DIAGNOSIS — R1013 Epigastric pain: Secondary | ICD-10-CM

## 2020-03-14 LAB — CBC WITH DIFFERENTIAL/PLATELET
Basophils Absolute: 0 10*3/uL (ref 0.0–0.1)
Basophils Relative: 0.5 % (ref 0.0–3.0)
Eosinophils Absolute: 0.1 10*3/uL (ref 0.0–0.7)
Eosinophils Relative: 2.4 % (ref 0.0–5.0)
HCT: 46.6 % (ref 39.0–52.0)
Hemoglobin: 15.4 g/dL (ref 13.0–17.0)
Lymphocytes Relative: 33.6 % (ref 12.0–46.0)
Lymphs Abs: 2.1 10*3/uL (ref 0.7–4.0)
MCHC: 33.1 g/dL (ref 30.0–36.0)
MCV: 82.4 fl (ref 78.0–100.0)
Monocytes Absolute: 0.5 10*3/uL (ref 0.1–1.0)
Monocytes Relative: 8 % (ref 3.0–12.0)
Neutro Abs: 3.4 10*3/uL (ref 1.4–7.7)
Neutrophils Relative %: 55.5 % (ref 43.0–77.0)
Platelets: 211 10*3/uL (ref 150.0–400.0)
RBC: 5.65 Mil/uL (ref 4.22–5.81)
RDW: 14.3 % (ref 11.5–15.5)
WBC: 6.1 10*3/uL (ref 4.0–10.5)

## 2020-03-14 LAB — HEPATIC FUNCTION PANEL
ALT: 145 U/L — ABNORMAL HIGH (ref 0–53)
AST: 74 U/L — ABNORMAL HIGH (ref 0–37)
Albumin: 4.6 g/dL (ref 3.5–5.2)
Alkaline Phosphatase: 99 U/L (ref 39–117)
Bilirubin, Direct: 0.2 mg/dL (ref 0.0–0.3)
Total Bilirubin: 0.7 mg/dL (ref 0.2–1.2)
Total Protein: 7.9 g/dL (ref 6.0–8.3)

## 2020-03-14 LAB — BASIC METABOLIC PANEL
BUN: 10 mg/dL (ref 6–23)
CO2: 29 mEq/L (ref 19–32)
Calcium: 9.4 mg/dL (ref 8.4–10.5)
Chloride: 102 mEq/L (ref 96–112)
Creatinine, Ser: 0.93 mg/dL (ref 0.40–1.50)
GFR: 87.93 mL/min (ref >60.00)
Glucose, Bld: 96 mg/dL (ref 70–99)
Potassium: 3.9 mEq/L (ref 3.5–5.1)
Sodium: 139 mEq/L (ref 135–145)

## 2020-03-14 LAB — C-REACTIVE PROTEIN: CRP: <1 mg/dL (ref 0.5–20.0)

## 2020-03-14 MED ORDER — SUTAB 1479-225-188 MG PO TABS: 1.0000 | ORAL_TABLET | Freq: Once | ORAL | 0 refills | Status: AC

## 2020-03-14 NOTE — Patient Instructions (Addendum)
If you are age 45 or older, your body mass index should be between 23-30. Your Body mass index is 26.37 kg/m. If this is out of the aforementioned range listed, please consider follow up with your Primary Care Provider.  If you are age 61 or younger, your body mass index should be between 19-25. Your Body mass index is 26.37 kg/m. If this is out of the aformentioned range listed, please consider follow up with your Primary Care Provider.   Your provider has requested that you go to the basement level for lab work before leaving today. Press "B" on the elevator. The lab is located at the first door on the left as you exit the elevator.   You are scheduled on 03/20/2020 at 2:00pm. You should arrive 15 minutes prior to your appointment time for registration. Please follow the written instructions below on the day of your exam:  WARNING: IF YOU ARE ALLERGIC TO IODINE/X-RAY DYE, PLEASE NOTIFY RADIOLOGY IMMEDIATELY AT [phone]! YOU WILL BE GIVEN A 13 HOUR PREMEDICATION PREP.  1) Do not eat or drink anything after 10:00am (4 hours prior to your test) 2) You have been given 2 bottles of oral contrast to drink. The solution may taste better if refrigerated, but do NOT add ice or any other liquid to this solution. Shake well before drinking.    Drink 1 bottle of contrast @ 12:00pm (2 hours prior to your exam)  Drink 1 bottle of contrast @ 1:00 pm (1 hour prior to your exam)  You may take any medications as prescribed with a small amount of water, if necessary. If you take any of the following medications: METFORMIN, GLUCOPHAGE, GLUCOVANCE, AVANDAMET, RIOMET, FORTAMET, Lawrence MET, JANUMET, GLUMETZA or METAGLIP, you MAY be asked to HOLD this medication 48 hours AFTER the exam.  The purpose of you drinking the oral contrast is to aid in the visualization of your intestinal tract. The contrast solution may cause some diarrhea. Depending on your individual set of symptoms, you may also receive an  intravenous injection of x-ray contrast/dye. Plan on being at [facility] for 30 minutes or longer, depending on the type of exam you are having performed.  This test typically takes 30-45 minutes to complete.  Follow up with your Primary care physician regarding Chest Pain to be seen prior to your colonoscopy. It has been recommended to you by your physician that you have a(n) colonoscopy completed. Per your request, we did not schedule the procedure(s) today. Please contact our office at [phone] should you decide to have the procedure completed.  Due to recent changes in healthcare laws, you may see the results of your imaging and laboratory studies on MyChart before your provider has had a chance to review them.  We understand that in some cases there may be results that are confusing or concerning to you. Not all laboratory results come back in the same time frame and the provider may be waiting for multiple results in order to interpret others.  Please give [place] 48 hours in order for your provider to thoroughly review all the results before contacting the office for clarification of your results.    Thank you for choosing [facility] Noralyn Pick, CRNP

## 2020-03-16 LAB — CELIAC PANEL 10
Antigliadin Abs, IgA: 6 units (ref 0–19)
Endomysial IgA: NEGATIVE
Gliadin IgG: 2 units (ref 0–19)
IgA/Immunoglobulin A, Serum: 262 mg/dL (ref 90–386)
Tissue Transglut Ab: 2 U/mL (ref 0–5)
Transglutaminase IgA: <2 U/mL (ref 0–3)

## 2020-03-20 ENCOUNTER — Encounter: Payer: Self-pay | Admitting: Cardiovascular Disease

## 2020-03-20 ENCOUNTER — Telehealth: Payer: Self-pay | Admitting: Cardiovascular Disease

## 2020-03-20 ENCOUNTER — Ambulatory Visit (INDEPENDENT_AMBULATORY_CARE_PROVIDER_SITE_OTHER)
Admission: RE | Admit: 2020-03-20 | Discharge: 2020-03-20 | Disposition: A | Discharge status: Home / Self Care | Payer: BC Managed Care – PPO | Source: Ambulatory Visit | Attending: Nurse Practitioner | Admitting: Nurse Practitioner

## 2020-03-20 ENCOUNTER — Other Ambulatory Visit: Payer: Self-pay

## 2020-03-20 DIAGNOSIS — R1013 Epigastric pain: Secondary | ICD-10-CM

## 2020-03-20 DIAGNOSIS — R7989 Other specified abnormal findings of blood chemistry: Secondary | ICD-10-CM

## 2020-03-20 DIAGNOSIS — K51919 Ulcerative colitis, unspecified with unspecified complications: Secondary | ICD-10-CM

## 2020-03-20 DIAGNOSIS — R1031 Right lower quadrant pain: Secondary | ICD-10-CM | POA: Diagnosis not present

## 2020-03-20 DIAGNOSIS — R197 Diarrhea, unspecified: Secondary | ICD-10-CM

## 2020-03-20 LAB — ALPHA-1-ANTITRYPSIN: A-1 Antitrypsin, Ser: 136 mg/dL (ref 83–199)

## 2020-03-20 LAB — HEPATITIS A ANTIBODY, TOTAL: Hepatitis A AB,Total: NONREACTIVE

## 2020-03-20 LAB — HEPATITIS B SURFACE ANTIBODY,QUALITATIVE: Hep B S Ab: NONREACTIVE

## 2020-03-20 LAB — IGG: IgG (Immunoglobin G), Serum: 1621 mg/dL (ref 600–1640)

## 2020-03-20 LAB — HEPATITIS B SURFACE ANTIGEN: Hepatitis B Surface Ag: NONREACTIVE

## 2020-03-20 LAB — ANA: Anti Nuclear Antibody (ANA): NEGATIVE

## 2020-03-20 LAB — HEPATITIS C ANTIBODY
Hepatitis C Ab: NONREACTIVE
SIGNAL TO CUT-OFF: 0.18 (ref <1.00)

## 2020-03-20 LAB — TISSUE TRANSGLUTAMINASE, IGA: (tTG) Ab, IgA: 1 U/mL

## 2020-03-20 LAB — MITOCHONDRIAL ANTIBODIES: Mitochondrial M2 Ab, IgG: <=20 U

## 2020-03-20 LAB — HEPATITIS B CORE ANTIBODY, TOTAL: Hep B Core Total Ab: NONREACTIVE

## 2020-03-20 LAB — ANTI-SMOOTH MUSCLE ANTIBODY, IGG: Actin (Smooth Muscle) Antibody (IGG): <20 U (ref <20)

## 2020-03-20 LAB — CERULOPLASMIN: Ceruloplasmin: 28 mg/dL (ref 18–36)

## 2020-03-20 MED ORDER — IOHEXOL 300 MG/ML  SOLN
100.0000 mL | Freq: Once | INTRAMUSCULAR | Status: AC | PRN
  Administered 2020-03-20: 100 mL via INTRAVENOUS

## 2020-03-20 NOTE — Telephone Encounter (Signed)
Was called to the CT scanner this afternoon to see Justin Barrera who was having some complications after just receiving IV contrast.  Mr. [name] is here for CT scan.  He received oral and IV contrast.  Within 1 minute of receiving IV contrast he had itching on the upper aspect of his mouth.  He denies itching anywhere else.  He denies any shortness of breath or wheezing.  He denies any feeling that his throat is closing.  On exam his oropharynx is normal.  His uvula is normal.  His throat is wide open.  Examination of his lungs reveals no wheezing and no stridor.  His pulse is normal.  His pulse pressure is normal.  There is no sweating.  The patient is sitting upright without any dizziness.  In my opinion he is stable to be sent home.  We were able to complete the CT scan.  We will add IV contrast as one of his allergies.    Mertie Moores, MD  03/20/2020 2:28 PM    [facility] [place],  [address], [place]  [address] Phone: [phone]; Fax: [phone]

## 2020-03-21 NOTE — Telephone Encounter (Signed)
S/p CT per GI.  Noted.

## 2020-03-22 ENCOUNTER — Ambulatory Visit: Payer: BC Managed Care – PPO | Admitting: Nurse Practitioner

## 2020-03-22 ENCOUNTER — Other Ambulatory Visit: Payer: Self-pay

## 2020-03-22 VITALS — BP 128/88 | HR 63 | Temp 97.5°F | Ht 67.0 in | Wt 170.0 lb

## 2020-03-22 DIAGNOSIS — R5381 Other malaise: Secondary | ICD-10-CM | POA: Diagnosis not present

## 2020-03-22 DIAGNOSIS — F8081 Childhood onset fluency disorder: Secondary | ICD-10-CM | POA: Diagnosis not present

## 2020-03-22 DIAGNOSIS — R439 Unspecified disturbances of smell and taste: Secondary | ICD-10-CM

## 2020-03-22 DIAGNOSIS — J1282 Pneumonia due to coronavirus disease 2019: Secondary | ICD-10-CM

## 2020-03-22 DIAGNOSIS — Z8616 Personal history of COVID-19: Secondary | ICD-10-CM

## 2020-03-22 DIAGNOSIS — R413 Other amnesia: Secondary | ICD-10-CM | POA: Insufficient documentation

## 2020-03-22 DIAGNOSIS — U071 COVID-19: Secondary | ICD-10-CM

## 2020-03-22 NOTE — Assessment & Plan Note (Signed)
Covid pneumonia:  Glad you are doing better!  Will order follow up chest x ray to be completed next week - will call with results  Continue deep breathing exercises  Stay active  Stay well hydrated   Elevated Liver enzymes:  Please keep follow up with GI   Brain fog / memory loss Olfactory dysfunction Stutter:  Will place referral to neurology  Hand out given on memory care  Fatigue Physical deconditioning:  Stay active  Stay well hydrated  Will place referral to PT   Follow up:  Follow up as needed

## 2020-03-22 NOTE — Progress Notes (Signed)
@Patient  ID: Justin Barrera, male    DOB: 1976/04/15, 44 y.o.   MRN: [id number]  Chief Complaint  Patient presents with  . Follow-up    2 month follow up, Still having brain fog and some chest discomfort.     Referring provider: Owens Loffler, MD   44 year old male with history of cough variant asthma, ulcerative colitis, hypercholesterolemia. Diagnosed with covid 11/22/19.  HPI  Patient presents today for post Covid care clinic visit follow-up.  He states that since his last visit here he is improving.  He has been evaluated by GI and did have a CT of his abdomen yesterday.  His lower lung fields did appear clear and the CT report.  Patient states that he is still very fatigued.  He also complains today of an ongoing stutter since being diagnosed with Covid.  Patient states that he did stutter slightly as a child.  And this has not been an issue in years until he was diagnosed with Covid.  He also complains today of ongoing memory loss and brain fog.  He states that he does have periods of olfactory dysfunction as well.  He states that his taste and smell comes and goes. Denies f/c/s, n/v/d, hemoptysis, PND, chest pain or edema.       Allergies  Allergen Reactions  . Latex Anaphylaxis  . Iohexol Itching    Pt sneezed several times within 90 seconds post injection. Developed itching in roof of mouth.     Immunization History  Administered Date(s) Administered  . Influenza-Unspecified 06/17/2016, 05/02/2017  . Tdap 11/17/2011    Past Medical History:  Diagnosis Date  . Anxiety   . Asthma   . COVID-19 10/2019  . Elevated LFTs   . Pure hypercholesterolemia   . Ulcerative colitis, unspecified   . Vertigo     Tobacco History: Social History   Tobacco Use  Smoking Status Former Smoker  . Types: Cigarettes  . Quit date: 09/01/1997  . Years since quitting: 22.5  Smokeless Tobacco Never Used  Tobacco Comment   20 year ago   Counseling given: Not Answered Comment:  20 year ago   Outpatient Encounter Medications as of 03/22/2020  Medication Sig  . albuterol (PROVENTIL HFA) 108 (90 Base) MCG/ACT inhaler Inhale 2 puffs into the lungs every 6 (six) hours as needed for wheezing or shortness of breath (coughing fit).  . beclomethasone (QVAR) 80 MCG/ACT inhaler Inhale 1 puff into the lungs 2 (two) times daily.  . [DISCONTINUED] benzonatate (TESSALON) 100 MG capsule Take 1 capsule (100 mg total) by mouth 2 (two) times daily as needed for cough. (Patient not taking: Reported on 01/20/2020)  . [DISCONTINUED] meclizine (ANTIVERT) 12.5 MG tablet Take 1 tablet (12.5 mg total) by mouth 3 (three) times daily as needed for dizziness. (Patient not taking: Reported on 01/20/2020)   No facility-administered encounter medications on file as of 03/22/2020.     Review of Systems  Review of Systems  Constitutional: Positive for fatigue and fever.  HENT: Negative.   Respiratory: Negative for cough, shortness of breath and wheezing.   Cardiovascular: Negative.  Negative for chest pain, palpitations and leg swelling.  Gastrointestinal: Negative.   Allergic/Immunologic: Negative.   Neurological: Positive for weakness.  Psychiatric/Behavioral: Positive for decreased concentration.       Physical Exam  BP 128/88   Pulse 63   Temp (!) 97.5 F (36.4 C)   Ht 5' 7"  (1.702 m)   Wt 170 lb 0.1 oz (77.1  kg)   SpO2 98%   BMI 26.63 kg/m   Wt Readings from Last 5 Encounters:  03/22/20 170 lb 0.1 oz (77.1 kg)  03/14/20 168 lb 6 oz (76.4 kg)  01/20/20 164 lb 8 oz (74.6 kg)  12/21/19 165 lb (74.8 kg)  11/26/17 163 lb 4 oz (74 kg)     Physical Exam Vitals and nursing note reviewed.  Constitutional:      General: He is not in acute distress.    Appearance: He is well-developed.  Cardiovascular:     Rate and Rhythm: Normal rate and regular rhythm.  Pulmonary:     Effort: Pulmonary effort is normal.     Breath sounds: Normal breath sounds.  Musculoskeletal:     Right  lower leg: No edema.     Left lower leg: No edema.  Skin:    General: Skin is warm and dry.  Neurological:     Mental Status: He is alert and oriented to person, place, and time.  Psychiatric:        Mood and Affect: Mood normal.        Behavior: Behavior normal.      Imaging: CT ABDOMEN PELVIS W CONTRAST  Result Date: 03/21/2020 CLINICAL DATA:  44 year old male with history of ulcerative colitis presenting with epigastric pain and elevated LFTs. EXAM: CT ABDOMEN AND PELVIS WITH CONTRAST TECHNIQUE: Multidetector CT imaging of the abdomen and pelvis was performed using the standard protocol following bolus administration of intravenous contrast. CONTRAST:  164m OMNIPAQUE IOHEXOL 300 MG/ML  SOLN COMPARISON:  CT the abdomen and pelvis 11/10/2006. FINDINGS: Lower chest: Unremarkable. Hepatobiliary: Diffuse low attenuation throughout the hepatic parenchyma indicative of hepatic steatosis. No discrete cystic or solid hepatic lesions. No intra or extrahepatic biliary ductal dilatation. Gallbladder is normal in appearance. Pancreas: No pancreatic mass. No pancreatic ductal dilatation. No pancreatic or peripancreatic fluid collections or inflammatory changes. Spleen: Unremarkable. Adrenals/Urinary Tract: Bilateral kidneys and bilateral adrenal glands are normal in appearance. No hydroureteronephrosis. Urinary bladder is normal in appearance. Stomach/Bowel: Normal appearance of the stomach. No pathologic dilatation of small bowel or colon. No significant mural thickening, mucosal hyperenhancement or surrounding inflammatory changes noted in association with either the small bowel or colon. Normal appendix. Vascular/Lymphatic: Atherosclerotic calcifications in the pelvic vasculature. No aneurysm or dissection noted in the abdominal or pelvic vasculature. No lymphadenopathy noted in the abdomen or pelvis. Reproductive: Prostate gland and seminal vesicles are unremarkable in appearance. Other: No significant  volume of ascites.  No pneumoperitoneum. Musculoskeletal: There are no aggressive appearing lytic or blastic lesions noted in the visualized portions of the skeleton. IMPRESSION: 1. No acute findings are noted in the abdomen or pelvis to account for the patient's symptoms. 2. Hepatic steatosis. 3. Atherosclerosis. Electronically Signed   By: DVinnie LangtonM.D.   On: 03/21/2020 08:53     Assessment & Plan:   History of COVID-19 Covid pneumonia:  Glad you are doing better!  Will order follow up chest x ray to be completed next week - will call with results  Continue deep breathing exercises  Stay active  Stay well hydrated   Elevated Liver enzymes:  Please keep follow up with GI   Brain fog / memory loss Olfactory dysfunction Stutter:  Will place referral to neurology  Hand out given on memory care  Fatigue Physical deconditioning:  Stay active  Stay well hydrated  Will place referral to PT   Follow up:  Follow up as needed  Fenton Foy, NP 03/22/2020

## 2020-03-22 NOTE — Patient Instructions (Addendum)
Covid pneumonia:  Glad you are doing better!  Will order follow up chest x ray to be completed next week - will call with results  Continue deep breathing exercises  Stay active  Stay well hydrated   Elevated Liver enzymes:  Please keep follow up with GI   Brain fog / memory loss Olfactory dysfunction Stutter:  Will place referral to neurology  Hand out given on memory care  Fatigue Physical deconditioning:  Stay active  Stay well hydrated  Will place referral to PT   Follow up:  Follow up as needed

## 2020-03-25 NOTE — Progress Notes (Signed)
Justin Barrera T. Justin Cham, MD, [place] at [facility] [place], [address]  Phone: [phone]  FAX: [phone]  [name] - 44 y.o. male  MRN [id number]  Date of Birth: [birth year]  Date: 03/26/2020  PCP: Owens Loffler, MD  Referral: Owens Loffler, MD  Chief Complaint  Patient presents with  . Chest Pain    since Covid-Dx in Justin 2021    This visit occurred during the SARS-CoV-2 public health emergency.  Safety protocols were in place, including screening questions prior to the visit, additional usage of staff PPE, and extensive cleaning of exam room while observing appropriate contact time as indicated for disinfecting solutions.   Subjective:   Justin Barrera is a 44 y.o. very pleasant male patient with Body mass index is 26.35 kg/m. who presents with the following:  I actually have not seen the patient in > 2 years, but presents in follow-up most recently seen at the post-covid clinic and from GI.  He had a CT of the abdomen and pelvis, which was unremarkable with the exception of some fatty liver and atherosclerosis.  All since Covid infection.  No pain with deep breath. Now only bi-weekly.  Yesterday, it will happen at work.   Lab Results  Component Value Date   DDIMER 1.33 (H) 12/21/2019    He did have an elevated d-dimer, 12/21/2019.  On chart review, the patient did have some elevated LFTs, and the post Covid clinic did refer him to GI.  Hepatic Function Latest Ref Rng & Units 03/14/2020 12/21/2019 11/26/2017  Total Protein 6.0 - 8.3 g/dL 7.9 7.2 7.6  Albumin 3.5 - 5.2 g/dL 4.6 4.1 4.1  AST 0 - 37 U/L 74(H) 41(H) 46(H)  ALT 0 - 53 U/L 145(H) 89(H) 80(H)  Alk Phosphatase 39 - 117 U/L 99 151(H) 102  Total Bilirubin 0.2 - 1.2 mg/dL 0.7 0.3 0.6  Bilirubin, Direct 0.0 - 0.3 mg/dL 0.2 - 0.1   He also had a CT of the abdomen and pelvis with contrast  CT ABDOMEN  PELVIS W CONTRAST  Result Date: 03/21/2020 CLINICAL DATA:  44 year old male with history of ulcerative colitis presenting with epigastric pain and elevated LFTs. EXAM: CT ABDOMEN AND PELVIS WITH CONTRAST TECHNIQUE: Multidetector CT imaging of the abdomen and pelvis was performed using the standard protocol following bolus administration of intravenous contrast. CONTRAST:  157m OMNIPAQUE IOHEXOL 300 MG/ML  SOLN COMPARISON:  CT the abdomen and pelvis 11/10/2006. FINDINGS: Lower chest: Unremarkable. Hepatobiliary: Diffuse low attenuation throughout the hepatic parenchyma indicative of hepatic steatosis. No discrete cystic or solid hepatic lesions. No intra or extrahepatic biliary ductal dilatation. Gallbladder is normal in appearance. Pancreas: No pancreatic mass. No pancreatic ductal dilatation. No pancreatic or peripancreatic fluid collections or inflammatory changes. Spleen: Unremarkable. Adrenals/Urinary Tract: Bilateral kidneys and bilateral adrenal glands are normal in appearance. No hydroureteronephrosis. Urinary bladder is normal in appearance. Stomach/Bowel: Normal appearance of the stomach. No pathologic dilatation of small bowel or colon. No significant mural thickening, mucosal hyperenhancement or surrounding inflammatory changes noted in association with either the small bowel or colon. Normal appendix. Vascular/Lymphatic: Atherosclerotic calcifications in the pelvic vasculature. No aneurysm or dissection noted in the abdominal or pelvic vasculature. No lymphadenopathy noted in the abdomen or pelvis. Reproductive: Prostate gland and seminal vesicles are unremarkable in appearance. Other: No significant volume of ascites.  No pneumoperitoneum. Musculoskeletal: There are no aggressive appearing lytic or blastic lesions noted in  the visualized portions of the skeleton. IMPRESSION: 1. No acute findings are noted in the abdomen or pelvis to account for the patient's symptoms. 2. Hepatic steatosis. 3.  Atherosclerosis. Electronically Signed   By: Vinnie Langton M.D.   On: 03/21/2020 08:53    He also has had some challenges from the olfactory is well as cognitive aspect, and he does have follow-up with neurology about this.  This is in terms of some memory issues.  Memory still not doing well.  HA has been improving. UC has gotten a lot worse.   Shortness of breath with minimal activity.  He also has some chest pain intermittently.  He did have an elevated D-dimer in April 2021.  Review of Systems is noted in the HPI, as appropriate  Objective:   BP 100/72   Pulse 70   Temp 98 F (36.7 C) (Temporal)   Ht _0  (1.702 m)   Wt 168 lb 4 oz (76.3 kg)   SpO2 97%   BMI 26.35 kg/m   GEN: No acute distress; alert,appropriate. PULM: Breathing comfortably in no respiratory distress PSYCH: Normally interactive.  CV: RRR, no m/g/r  PULM: Normal respiratory rate, no accessory muscle use. No wheezes, crackles or rhonchi   Laboratory and Imaging Data: Results for orders placed or performed in visit on 03/14/20  Celiac panel 10  Result Value Ref Range   Antigliadin Abs, IgA 6 0 - 19 units   Gliadin IgG 2 0 - 19 units   Transglutaminase IgA <2 0 - 3 U/mL   Tissue Transglut Ab 2 0 - 5 U/mL   Endomysial IgA Negative Negative   IgA/Immunoglobulin A, Serum 262 90 - 386 mg/dL  Hepatitis B core antibody, total  Result Value Ref Range   Hep B Core Total Ab NON-REACTIVE NON-REACTI  Hepatitis A antibody, total  Result Value Ref Range   Hepatitis A AB,Total NON-REACTIVE NON-REACTI  Hepatitis C antibody  Result Value Ref Range   Hepatitis C Ab NON-REACTIVE NON-REACTI   SIGNAL TO CUT-OFF 0.18 <1.00  Hepatitis B surface antigen  Result Value Ref Range   Hepatitis B Surface Ag NON-REACTIVE NON-REACTI  Hepatitis B surface antibody,qualitative  Result Value Ref Range   Hep B S Ab NON-REACTIVE NON-REACTI  Ceruloplasmin  Result Value Ref Range   Ceruloplasmin 28 18 - 36 mg/dL  Anti-smooth  muscle antibody, IgG  Result Value Ref Range   Actin (Smooth Muscle) Antibody (IGG) <20 <20 U  ANA  Result Value Ref Range   Anti Nuclear Antibody (ANA) NEGATIVE NEGATIVE  Mitochondrial antibodies  Result Value Ref Range   Mitochondrial M2 Ab, IgG < OR = 20.0 U  Alpha-1-antitrypsin  Result Value Ref Range   A-1 Antitrypsin, Ser 136 83 - 199 mg/dL  IgG  Result Value Ref Range   IgG (Immunoglobin G), Serum 1,621 600 - 1,640 mg/dL  Tissue transglutaminase, IgA  Result Value Ref Range   (tTG) Ab, IgA 1 U/mL  C-reactive protein  Result Value Ref Range   CRP <1.0 0.5 - 20.0 mg/dL  Hepatic function panel  Result Value Ref Range   Total Bilirubin 0.7 0.2 - 1.2 mg/dL   Bilirubin, Direct 0.2 0.0 - 0.3 mg/dL   Alkaline Phosphatase 99 39 - 117 U/L   AST 74 (H) 0 - 37 U/L   ALT 145 (H) 0 - 53 U/L   Total Protein 7.9 6.0 - 8.3 g/dL   Albumin 4.6 3.5 - 5.2 g/dL  CBC with Differential/Platelet  Result Value Ref Range   WBC 6.1 4.0 - 10.5 K/uL   RBC 5.65 4.22 - 5.81 Mil/uL   Hemoglobin 15.4 13.0 - 17.0 g/dL   HCT 46.6 39 - 52 %   MCV 82.4 78.0 - 100.0 fl   MCHC 33.1 30.0 - 36.0 g/dL   RDW 14.3 11.5 - 15.5 %   Platelets 211.0 150 - 400 K/uL   Neutrophils Relative % 55.5 43 - 77 %   Lymphocytes Relative 33.6 12 - 46 %   Monocytes Relative 8.0 3 - 12 %   Eosinophils Relative 2.4 0 - 5 %   Basophils Relative 0.5 0 - 3 %   Neutro Abs 3.4 1.4 - 7.7 K/uL   Lymphs Abs 2.1 0.7 - 4.0 K/uL   Monocytes Absolute 0.5 0 - 1 K/uL   Eosinophils Absolute 0.1 0 - 0 K/uL   Basophils Absolute 0.0 0 - 0 K/uL  Basic metabolic panel  Result Value Ref Range   Sodium 139 135 - 145 mEq/L   Potassium 3.9 3.5 - 5.1 mEq/L   Chloride 102 96 - 112 mEq/L   CO2 29 19 - 32 mEq/L   Glucose, Bld 96 70 - 99 mg/dL   BUN 10 6 - 23 mg/dL   Creatinine, Ser 0.93 0.40 - 1.50 mg/dL   GFR 87.93 >60.00 mL/min   Calcium 9.4 8.4 - 10.5 mg/dL    CT ABDOMEN PELVIS W CONTRAST  Result Date: 03/21/2020 CLINICAL DATA:   44 year old male with history of ulcerative colitis presenting with epigastric pain and elevated LFTs. EXAM: CT ABDOMEN AND PELVIS WITH CONTRAST TECHNIQUE: Multidetector CT imaging of the abdomen and pelvis was performed using the standard protocol following bolus administration of intravenous contrast. CONTRAST:  179m OMNIPAQUE IOHEXOL 300 MG/ML  SOLN COMPARISON:  CT the abdomen and pelvis 11/10/2006. FINDINGS: Lower chest: Unremarkable. Hepatobiliary: Diffuse low attenuation throughout the hepatic parenchyma indicative of hepatic steatosis. No discrete cystic or solid hepatic lesions. No intra or extrahepatic biliary ductal dilatation. Gallbladder is normal in appearance. Pancreas: No pancreatic mass. No pancreatic ductal dilatation. No pancreatic or peripancreatic fluid collections or inflammatory changes. Spleen: Unremarkable. Adrenals/Urinary Tract: Bilateral kidneys and bilateral adrenal glands are normal in appearance. No hydroureteronephrosis. Urinary bladder is normal in appearance. Stomach/Bowel: Normal appearance of the stomach. No pathologic dilatation of small bowel or colon. No significant mural thickening, mucosal hyperenhancement or surrounding inflammatory changes noted in association with either the small bowel or colon. Normal appendix. Vascular/Lymphatic: Atherosclerotic calcifications in the pelvic vasculature. No aneurysm or dissection noted in the abdominal or pelvic vasculature. No lymphadenopathy noted in the abdomen or pelvis. Reproductive: Prostate gland and seminal vesicles are unremarkable in appearance. Other: No significant volume of ascites.  No pneumoperitoneum. Musculoskeletal: There are no aggressive appearing lytic or blastic lesions noted in the visualized portions of the skeleton. IMPRESSION: 1. No acute findings are noted in the abdomen or pelvis to account for the patient's symptoms. 2. Hepatic steatosis. 3. Atherosclerosis. Electronically Signed   By: DVinnie LangtonM.D.    On: 03/21/2020 08:53     Assessment and Plan:     ICD-10-CM   1. Dyspnea on exertion  R06.00 [facility]  2. Elevated liver enzymes  R74.8   3. Shortness of breath  R06.02 [facility]  4. Elevated d-dimer  R79.89 [facility]  5. Olfactory impairment  R43.9   6. Pneumonia due to COVID-19 virus  U07.1 D-dimer, quantitative (  not at [facility])   J12.82   7. Memory loss  R41.3   8. Chest pain, unspecified type  R07.9 EKG 12-Lead    D-dimer, quantitative (not at [facility])   Total encounter time: 30 minutes. This includes total time spent on the day of encounter.  Extensive record review.  Multiple encounters, labs, tests all reviewed at the date of this patient encounter.  EKG: Normal sinus rhythm. Normal axis, normal R wave progression, No acute ST elevation or depression.   Complicated medical case, the patient is still having some dyspnea on exertion and chest pain.  Has improved somewhat.  This can be elevated in the setting of acute VHQIT-64, but certainly could also have a pulmonary embolism.  I am going to repeat his D-dimer.  If this is negative then we can be reassured that he does not have any blood clot.  If this is positive, then I will obtain a CT angiogram of the patient's chest.  Continues to have some impairment from his COVID-19 vaccine.  Most concerning is his memory loss, and he has an upcoming appointment with neurology.  He is also being managed by GI.  Follow-up: No follow-ups on file.  No orders of the defined types were placed in this encounter.  There are no discontinued medications. Orders Placed This Encounter  Procedures  . D-dimer, quantitative (not at [facility])  . EKG 12-Lead    Signed,  Sihaam Chrobak T. Timi Reeser, MD   Outpatient Encounter Medications as of 03/26/2020  Medication Sig  . albuterol (PROVENTIL HFA) 108 (90 Base) MCG/ACT inhaler Inhale 2 puffs into the lungs every 6 (six) hours as  needed for wheezing or shortness of breath (coughing fit).  . beclomethasone (QVAR) 80 MCG/ACT inhaler Inhale 1 puff into the lungs 2 (two) times daily.   No facility-administered encounter medications on file as of 03/26/2020.

## 2020-03-26 ENCOUNTER — Ambulatory Visit
Admission: RE | Admit: 2020-03-26 | Discharge: 2020-03-26 | Disposition: A | Discharge status: Home / Self Care | Payer: BC Managed Care – PPO | Source: Ambulatory Visit | Attending: Nurse Practitioner | Admitting: Nurse Practitioner

## 2020-03-26 ENCOUNTER — Other Ambulatory Visit: Payer: Self-pay

## 2020-03-26 ENCOUNTER — Ambulatory Visit: Payer: BC Managed Care – PPO | Admitting: Family Medicine

## 2020-03-26 ENCOUNTER — Encounter: Payer: Self-pay | Admitting: Family Medicine

## 2020-03-26 VITALS — BP 100/72 | HR 70 | Temp 98.0°F | Ht 67.0 in | Wt 168.2 lb

## 2020-03-26 DIAGNOSIS — J1282 Pneumonia due to coronavirus disease 2019: Secondary | ICD-10-CM

## 2020-03-26 DIAGNOSIS — R439 Unspecified disturbances of smell and taste: Secondary | ICD-10-CM

## 2020-03-26 DIAGNOSIS — R0602 Shortness of breath: Secondary | ICD-10-CM | POA: Diagnosis not present

## 2020-03-26 DIAGNOSIS — R0609 Other forms of dyspnea: Secondary | ICD-10-CM

## 2020-03-26 DIAGNOSIS — R0600 Dyspnea, unspecified: Secondary | ICD-10-CM

## 2020-03-26 DIAGNOSIS — U071 COVID-19: Secondary | ICD-10-CM

## 2020-03-26 DIAGNOSIS — R413 Other amnesia: Secondary | ICD-10-CM

## 2020-03-26 DIAGNOSIS — R748 Abnormal levels of other serum enzymes: Secondary | ICD-10-CM

## 2020-03-26 DIAGNOSIS — R7989 Other specified abnormal findings of blood chemistry: Secondary | ICD-10-CM | POA: Diagnosis not present

## 2020-03-26 DIAGNOSIS — R079 Chest pain, unspecified: Secondary | ICD-10-CM

## 2020-03-27 LAB — D-DIMER, QUANTITATIVE: D-Dimer, Quant: 0.22 mcg/mL FEU (ref <0.50)

## 2020-03-28 ENCOUNTER — Telehealth: Payer: Self-pay | Admitting: Nurse Practitioner

## 2020-03-28 ENCOUNTER — Other Ambulatory Visit: Payer: Self-pay

## 2020-03-28 DIAGNOSIS — R7989 Other specified abnormal findings of blood chemistry: Secondary | ICD-10-CM

## 2020-03-28 DIAGNOSIS — K51919 Ulcerative colitis, unspecified with unspecified complications: Secondary | ICD-10-CM

## 2020-03-28 NOTE — Telephone Encounter (Signed)
Notified the patient that his sutab was submitted to his pharmacy with the code and it should only cost him 40.00. Patient verbalized understanding and will contact the pharmacy.

## 2020-03-30 ENCOUNTER — Encounter: Payer: Self-pay | Admitting: Gastroenterology

## 2020-04-03 NOTE — Progress Notes (Signed)
Reviewed and agree with documentation and assessment and plan. K. Veena Brittin Janik , MD   

## 2020-04-10 ENCOUNTER — Telehealth: Payer: Self-pay | Admitting: Gastroenterology

## 2020-04-10 ENCOUNTER — Other Ambulatory Visit: Payer: Self-pay

## 2020-04-10 ENCOUNTER — Ambulatory Visit: Payer: BC Managed Care – PPO | Attending: Nurse Practitioner | Admitting: Physical Therapy

## 2020-04-10 ENCOUNTER — Ambulatory Visit: Payer: BC Managed Care – PPO | Admitting: Physical Therapy

## 2020-04-10 ENCOUNTER — Other Ambulatory Visit: Payer: Self-pay | Admitting: Gastroenterology

## 2020-04-10 ENCOUNTER — Ambulatory Visit (INDEPENDENT_AMBULATORY_CARE_PROVIDER_SITE_OTHER): Payer: BC Managed Care – PPO

## 2020-04-10 DIAGNOSIS — Z8616 Personal history of COVID-19: Secondary | ICD-10-CM

## 2020-04-10 DIAGNOSIS — M6281 Muscle weakness (generalized): Secondary | ICD-10-CM | POA: Diagnosis present

## 2020-04-10 DIAGNOSIS — Z1159 Encounter for screening for other viral diseases: Secondary | ICD-10-CM

## 2020-04-10 DIAGNOSIS — Z01818 Encounter for other preprocedural examination: Secondary | ICD-10-CM

## 2020-04-10 LAB — SARS CORONAVIRUS 2 (TAT 6-24 HRS): SARS Coronavirus 2: NEGATIVE

## 2020-04-10 NOTE — Telephone Encounter (Signed)
Pt is requesting a call back from a nurse to discuss if he needs to be tested for covid prior to his procedure on Thurs.

## 2020-04-10 NOTE — Telephone Encounter (Signed)
Sent the patient for a Covid test for procedure on Thursday.

## 2020-04-10 NOTE — Therapy (Signed)
[place], [place], [address] Phone: [phone]   Fax:  [phone]  Physical Therapy Evaluation  Patient Details  Name: Justin Barrera MRN: [id number] Date of Birth: 12-20-1975 Referring Provider (PT): Fenton Foy, NP   Encounter Date: 04/10/2020   PT End of Session - 04/10/20 0858    Visit Number 1    Number of Visits 1    PT Start Time 0905    PT Stop Time 0945    PT Time Calculation (min) 40 min    Activity Tolerance Patient tolerated treatment well    Behavior During Therapy [facility] for tasks assessed/performed           Past Medical History:  Diagnosis Date  . Anxiety   . Asthma   . COVID-19 10/2019  . Elevated LFTs   . Pure hypercholesterolemia   . Ulcerative colitis, unspecified   . Vertigo     Past Surgical History:  Procedure Laterality Date  . MOUTH SURGERY    . VASECTOMY      There were no vitals filed for this visit.    Subjective Assessment - 04/10/20 0907    Subjective Pt reports he had COVID late March/early April. Pt reports continued fatigue -- ex. picked up a bunch of bamboo and he was done for the day. Pt reports he's working full time (drives to [place] back) but had to change his schedule every other day (12 hour shifts). Pt works as a Network engineer. Pt reports continued chest pain s/p COVID. Pt notes that he is able to perform his daily activities without too many issues.    Limitations Lifting    How long can you sit comfortably? No issues    How long can you stand comfortably? No issues    How long can you walk comfortably? 1-2 hours of walking makes him very tired    Patient Stated Goals Regain strength and endurance    Currently in Pain? No/denies              [facility] PT Assessment - 04/10/20 0001      Assessment   Medical Diagnosis R53.81 (ICD-10-CM) - Physical deconditioning    Referring Provider (PT) Fenton Foy, NP    Onset Date/Surgical  Date 11/22/19      Balance Screen   Has the patient fallen in the past 6 months No      [place] residence    Living Arrangements Spouse/significant other    Type of [place] Multi-level    Alternate Level Stairs-Number of Steps 12      Prior Function   Level of Independence Independent      Cognition   Memory Impaired   Reportedly poor since COVID -- "brain fog"     Observation/Other Assessments   Focus on Therapeutic Outcomes (FOTO)  n/a -- 1 time visit only      Squat   Comments WFL with no weight      Single Leg Stance   Comments 1 min bilat      Strength   Overall Strength Comments grossly 4/5 for bilat hips, 5/5 UEs and knees      Transfers   Five time sit to stand comments  10.72 sec      6 Minute Walk- Baseline   6 Minute Walk- Baseline yes    HR (bpm)  76    02 Sat (%RA) 97 %    Perceived Rate of Exertion (Borg) 6-      6 Minute walk- Post Test   6 Minute Walk Post Test yes    HR (bpm) 85    02 Sat (%RA) 98 %    Perceived Rate of Exertion (Borg) 8-      6 minute walk test results    Aerobic Endurance Distance Walked 1641                      Objective measurements completed on examination: See above findings.               PT Education - 04/10/20 0949    Education Details Educated pt on strengthening and endurance/aerobic program. Discussed with pt safe HR to work in for moderate intensity exercise and measuring O2.    Person(s) Educated Patient    Methods Explanation;Demonstration;Tactile cues;Verbal cues;Handout    Comprehension Verbalized understanding;Returned demonstration;Verbal cues required            PT Short Term Goals - 04/10/20 0959      PT SHORT TERM GOAL #1   Title Pt will be independent with HEP    Time 1    Period Days    Status Achieved    Target Date 04/10/20                     Plan - 04/10/20 0949    Clinical  Impression Statement Pt is a 44 y/o M presenting to clinic s/p COVID on 11/22/19. Pt reports he has been improving slowly but mostly complains of fatigue and deconditioning. Pt is still fully capable of performing work and home activities without issue and demonstrates functional strength; however, he reports increased fatigue/recovery after any increased lifting or walking >1-2 hours. Pt's 6 min walk test of 492 meters is in the 25th percentile of age norms; however, RPE was [facility]. Pt has no OPPT needs at this time -- mostly requires a community strengthening and aerobic exercise program. PT provided HEP and educated pt on increasing his aerobic capacity at home. Pt verbalized understanding and knows to call clinic with any concerns or questions.    Personal Factors and Comorbidities Time since onset of injury/illness/exacerbation    Examination-Participation Restrictions Community Activity    Stability/Clinical Decision Making Stable/Uncomplicated    Clinical Decision Making Low    Rehab Potential Good    PT Frequency One time visit    PT Treatment/Interventions Therapeutic exercise    PT Next Visit Plan n/a    PT Home Exercise Plan Access Code: [email or web address]    Consulted and Agree with Plan of Care Patient           Patient will benefit from skilled therapeutic intervention in order to improve the following deficits and impairments:  Decreased endurance, Decreased strength  Visit Diagnosis: History of COVID-19  Muscle weakness (generalized)     Problem List Patient Active Problem List   Diagnosis Date Noted  . Memory loss 03/22/2020  . History of COVID-19 12/22/2019  . Pneumonia due to COVID-19 virus 12/07/2019  . Cough variant asthma 03/16/2015  . HYPERCHOLESTEROLEMIA 04/04/2009  . Ulcerative colitis ([place]) 04/04/2009  . Elevated LFTs 04/04/2009    [facility] April Ma L Shanyah Gattuso PT, DPT 04/10/2020, 10:09 AM  [facility] [address] [place], [place], [address] Phone: [phone]   Fax:  [phone]  Name:  Justin Barrera MRN: [id number] Date of Birth: 27-Sep-1975

## 2020-04-10 NOTE — Therapy (Signed)
[place], [place], [address] Phone: [phone]   Fax:  [phone]  Physical Therapy Treatment  Patient Details  Name: Justin Barrera MRN: [id number] Date of Birth: 45/05/1976 Referring Provider (PT): Fenton Foy, NP   Encounter Date: 04/10/2020   PT End of Session - 04/10/20 0858    Visit Number 1    Number of Visits 1    PT Start Time 0905    PT Stop Time 0945    PT Time Calculation (min) 40 min    Activity Tolerance Patient tolerated treatment well    Behavior During Therapy [facility] for tasks assessed/performed           Past Medical History:  Diagnosis Date  . Anxiety   . Asthma   . COVID-19 10/2019  . Elevated LFTs   . Pure hypercholesterolemia   . Ulcerative colitis, unspecified   . Vertigo     Past Surgical History:  Procedure Laterality Date  . MOUTH SURGERY    . VASECTOMY      There were no vitals filed for this visit.   Subjective Assessment - 04/10/20 0907    Subjective Pt reports he had COVID late March/early April. Pt reports continued fatigue -- ex. picked up a bunch of bamboo and he was done for the day. Pt reports he's working full time (drives to [place] back) but had to change his schedule every other day (12 hour shifts). Pt works as a Network engineer. Pt reports continued chest pain s/p COVID. Pt notes that he is able to perform his daily activities without too many issues.    Limitations Lifting    How long can you sit comfortably? No issues    How long can you stand comfortably? No issues    How long can you walk comfortably? 1-2 hours of walking makes him very tired    Patient Stated Goals Regain strength and endurance    Currently in Pain? No/denies              [facility] PT Assessment - 04/10/20 0001      Assessment   Medical Diagnosis R53.81 (ICD-10-CM) - Physical deconditioning    Referring Provider (PT) Fenton Foy, NP    Onset Date/Surgical Date  11/22/19      Balance Screen   Has the patient fallen in the past 6 months No      [place] residence    Living Arrangements Spouse/significant other    Type of [place] Multi-level    Alternate Level Stairs-Number of Steps 12      Prior Function   Level of Independence Independent      Cognition   Memory Impaired   Reportedly poor since COVID -- "brain fog"     Observation/Other Assessments   Focus on Therapeutic Outcomes (FOTO)  n/a -- 1 time visit only      Squat   Comments WFL with no weight      Single Leg Stance   Comments 1 min bilat      Strength   Overall Strength Comments grossly 4/5 for bilat hips, 5/5 UEs and knees      Transfers   Five time sit to stand comments  10.72 sec      6 Minute Walk- Baseline   6 Minute Walk- Baseline yes    HR (bpm) 76  02 Sat (%RA) 97 %    Perceived Rate of Exertion (Borg) 6-      6 Minute walk- Post Test   6 Minute Walk Post Test yes    HR (bpm) 85    02 Sat (%RA) 98 %    Perceived Rate of Exertion (Borg) 8-      6 minute walk test results    Aerobic Endurance Distance Walked 1641                                 PT Education - 04/10/20 0949    Education Details Educated pt on strengthening and endurance/aerobic program. Discussed with pt safe HR to work in for moderate intensity exercise and measuring O2.    Person(s) Educated Patient    Methods Explanation;Demonstration;Tactile cues;Verbal cues;Handout    Comprehension Verbalized understanding;Returned demonstration;Verbal cues required                      Plan - 04/10/20 0949    Clinical Impression Statement Pt is a 44 y/o M presenting to clinic s/p COVID on 11/22/19. Pt reports he has been improving slowly but mostly complains of fatigue and deconditioning. Pt is still fully capable to perform work and home activities without too much issue and  demonstrates functional strength; however, he mostly reports increased fatigue/recovery with any increased lifting or walking >1-2 hours. Pt has no OPPT needs at this time -- mostly requires a community strengthening and aerobic exercise program. PT provided HEP and educated pt on increasing his aerobic capacity. Pt verbalized understanding and knows to call clinic with any concerns or questions.    Personal Factors and Comorbidities Time since onset of injury/illness/exacerbation    Examination-Participation Restrictions Community Activity    Stability/Clinical Decision Making Stable/Uncomplicated    Clinical Decision Making Low    Rehab Potential Good    PT Frequency One time visit    PT Treatment/Interventions Therapeutic exercise    PT Next Visit Plan n/a    PT Home Exercise Plan Access Code: [email or web address]    Consulted and Agree with Plan of Care Patient           Patient will benefit from skilled therapeutic intervention in order to improve the following deficits and impairments:  Decreased endurance, Decreased strength  Visit Diagnosis: History of COVID-19  Muscle weakness (generalized)     Problem List Patient Active Problem List   Diagnosis Date Noted  . Memory loss 03/22/2020  . History of COVID-19 12/22/2019  . Pneumonia due to COVID-19 virus 12/07/2019  . Cough variant asthma 03/16/2015  . HYPERCHOLESTEROLEMIA 04/04/2009  . Ulcerative colitis ([place]) 04/04/2009  . Elevated LFTs 04/04/2009    Dala Breault April Beatrix Shipper Viraaj Vorndran 04/10/2020, 9:55 AM  [facility] [address] [place], [place], [address] Phone: [phone]   Fax:  [phone]  Name: Osiris Odriscoll MRN: [id number] Date of Birth: 45-Jul-1977

## 2020-04-12 ENCOUNTER — Ambulatory Visit (AMBULATORY_SURGERY_CENTER): Payer: BC Managed Care – PPO | Admitting: Gastroenterology

## 2020-04-12 ENCOUNTER — Other Ambulatory Visit: Payer: Self-pay

## 2020-04-12 ENCOUNTER — Encounter: Payer: Self-pay | Admitting: Gastroenterology

## 2020-04-12 VITALS — BP 103/73 | HR 64 | Temp 98.9°F | Resp 23 | Ht 67.0 in | Wt 168.0 lb

## 2020-04-12 DIAGNOSIS — K635 Polyp of colon: Secondary | ICD-10-CM

## 2020-04-12 DIAGNOSIS — K51919 Ulcerative colitis, unspecified with unspecified complications: Secondary | ICD-10-CM

## 2020-04-12 DIAGNOSIS — R7989 Other specified abnormal findings of blood chemistry: Secondary | ICD-10-CM

## 2020-04-12 DIAGNOSIS — D124 Benign neoplasm of descending colon: Secondary | ICD-10-CM

## 2020-04-12 MED ORDER — SODIUM CHLORIDE 0.9 % IV SOLN: 500.0000 mL | Freq: Once | INTRAVENOUS | Status: DC

## 2020-04-12 NOTE — Progress Notes (Signed)
Pt's states no medical or surgical changes since previsit or office visit.   Vitals BC

## 2020-04-12 NOTE — Progress Notes (Signed)
Called to room to assist during endoscopic procedure.  Patient ID and intended procedure confirmed with present staff. Received instructions for my participation in the procedure from the performing physician.  

## 2020-04-12 NOTE — Progress Notes (Signed)
A/ox3, pleased with MAC, report to RN 

## 2020-04-12 NOTE — Op Note (Signed)
[facility] Patient Name: Justin Barrera Procedure Date: 04/12/2020 1:26 PM MRN: [id number] Endoscopist: Mauri Pole , MD Age: 44 Referring MD:  Date of Birth: October 30, 1975 Gender: Male Account #: [id number] Procedure:                Colonoscopy Indications:              High risk colon cancer surveillance: Ulcerative                            pancolitis of 8 (or more) years duration Medicines:                Monitored Anesthesia Care Procedure:                Pre-Anesthesia Assessment:                           - Prior to the procedure, a History and Physical                            was performed, and patient medications and                            allergies were reviewed. The patient's tolerance of                            previous anesthesia was also reviewed. The risks                            and benefits of the procedure and the sedation                            options and risks were discussed with the patient.                            All questions were answered, and informed consent                            was obtained. Prior Anticoagulants: The patient has                            taken no previous anticoagulant or antiplatelet                            agents. ASA Grade Assessment: II - A patient with                            mild systemic disease. After reviewing the risks                            and benefits, the patient was deemed in                            satisfactory condition to undergo the procedure.  After obtaining informed consent, the colonoscope                            was passed under direct vision. Throughout the                            procedure, the patient's blood pressure, pulse, and                            oxygen saturations were monitored continuously. The                            Colonoscope was introduced through the anus and                            advanced to the the  cecum, identified by                            appendiceal orifice and ileocecal valve. The                            colonoscopy was performed without difficulty. The                            patient tolerated the procedure well. The quality                            of the bowel preparation was excellent. The                            ileocecal valve, appendiceal orifice, and rectum                            were photographed. Scope In: 1:37:50 PM Scope Out: 1:52:24 PM Scope Withdrawal Time: 0 hours 11 minutes 56 seconds  Total Procedure Duration: 0 hours 14 minutes 34 seconds  Findings:                 The perianal and digital rectal examinations were                            normal.                           A less than 1 mm polyp was found in the descending                            colon. The polyp was sessile. The polyp was removed                            with a cold biopsy forceps. Resection and retrieval                            were complete.  Normal mucosa was found in the entire colon.                            Biopsies were taken with a cold forceps for                            histology.                           Scattered small and large-mouthed diverticula were                            found in the sigmoid colon and ascending colon.                           Non-bleeding internal hemorrhoids were found during                            retroflexion. The hemorrhoids were small.                           The exam was otherwise without abnormality. Complications:            No immediate complications. Estimated Blood Loss:     Estimated blood loss was minimal. Impression:               - One less than 1 mm polyp in the descending colon,                            removed with a cold biopsy forceps. Resected and                            retrieved.                           - Normal mucosa in the entire examined colon.                             Biopsied.                           - Diverticulosis in the sigmoid colon and in the                            ascending colon.                           - Non-bleeding internal hemorrhoids.                           - The examination was otherwise normal. Recommendation:           - Patient has a contact number available for                            emergencies. The signs and symptoms of potential  delayed complications were discussed with the                            patient. Return to normal activities tomorrow.                            Written discharge instructions were provided to the                            patient.                           - Resume previous diet.                           - Continue present medications.                           - Await pathology results.                           - Repeat colonoscopy in 3 years for surveillance                            based on pathology results. Mauri Pole, MD 04/12/2020 1:58:48 PM This report has been signed electronically.

## 2020-04-12 NOTE — Patient Instructions (Signed)
Handout on polyps given to you today  Await pathology results   YOU HAD AN ENDOSCOPIC PROCEDURE TODAY AT [facility]:   Refer to the procedure report that was given to you for any specific questions about what was found during the examination.  If the procedure report does not answer your questions, please call your gastroenterologist to clarify.  If you requested that your care partner not be given the details of your procedure findings, then the procedure report has been included in a sealed envelope for you to review at your convenience later.  YOU SHOULD EXPECT: Some feelings of bloating in the abdomen. Passage of more gas than usual.  Walking can help get rid of the air that was put into your GI tract during the procedure and reduce the bloating. If you had a lower endoscopy (such as a colonoscopy or flexible sigmoidoscopy) you may notice spotting of blood in your stool or on the toilet paper. If you underwent a bowel prep for your procedure, you may not have a normal bowel movement for a few days.  Please Note:  You might notice some irritation and congestion in your nose or some drainage.  This is from the oxygen used during your procedure.  There is no need for concern and it should clear up in a day or so.  SYMPTOMS TO REPORT IMMEDIATELY:   Following lower endoscopy (colonoscopy or flexible sigmoidoscopy):  Excessive amounts of blood in the stool  Significant tenderness or worsening of abdominal pains  Swelling of the abdomen that is new, acute  Fever of 100F or higher  For urgent or emergent issues, a gastroenterologist can be reached at any hour by calling [phone]. Do not use MyChart messaging for urgent concerns.    DIET:  We do recommend a small meal at first, but then you may proceed to your regular diet.  Drink plenty of fluids but you should avoid alcoholic beverages for 24 hours.  ACTIVITY:  You should plan to take it easy for the rest of today and  you should NOT DRIVE or use heavy machinery until tomorrow (because of the sedation medicines used during the test).    FOLLOW UP: Our staff will call the number listed on your records 48-72 hours following your procedure to check on you and address any questions or concerns that you may have regarding the information given to you following your procedure. If we do not reach you, we will leave a message.  We will attempt to reach you two times.  During this call, we will ask if you have developed any symptoms of COVID 19. If you develop any symptoms (ie: fever, flu-like symptoms, shortness of breath, cough etc.) before then, please call [phone].  If you test positive for Covid 19 in the 2 weeks post procedure, please call and report this information to [place].    If any biopsies were taken you will be contacted by phone or by letter within the next 1-3 weeks.  Please call us at [phone] if you have not heard about the biopsies in 3 weeks.    SIGNATURES/CONFIDENTIALITY: You and/or your care partner have signed paperwork which will be entered into your electronic medical record.  These signatures attest to the fact that that the information above on your After Visit Summary has been reviewed and is understood.  Full responsibility of the confidentiality of this discharge information lies with you and/or your care-partner.

## 2020-04-16 ENCOUNTER — Telehealth: Payer: Self-pay

## 2020-04-16 NOTE — Telephone Encounter (Signed)
  Follow up Call-  Call back number 04/12/2020  Post procedure Call Back phone  # [phone]  Permission to leave phone message Yes  Some recent data might be hidden     Patient questions:  Do you have a fever, pain , or abdominal swelling? No. Pain Score  0 *  Have you tolerated food without any problems? Yes.    Have you been able to return to your normal activities? Yes.    Do you have any questions about your discharge instructions: Diet   No. Medications  No. Follow up visit  No.  Do you have questions or concerns about your Care? No.  Actions: * If pain score is 4 or above: No action needed, pain <4.  1. Have you developed a fever since your procedure? no  2.   Have you had an respiratory symptoms (SOB or cough) since your procedure? no  3.   Have you tested positive for COVID 19 since your procedure no  4.   Have you had any family members/close contacts diagnosed with the COVID 19 since your procedure?  no   If yes to any of these questions please route to Joylene John, RN and Joella Prince, RN

## 2020-04-24 ENCOUNTER — Encounter: Payer: Self-pay | Admitting: Gastroenterology

## 2020-04-27 ENCOUNTER — Telehealth: Payer: Self-pay | Admitting: Gastroenterology

## 2020-04-27 NOTE — Telephone Encounter (Signed)
Reviewed path letter with pt over the phone. Pt wants to know what the next step is regarding his care. Pt asked if he had Ulcerative colitis.

## 2020-04-27 NOTE — Telephone Encounter (Signed)
He will need follow up office visit for management of elevated LFT and ulcerative colitis. Please schedule follow up visit with me next available appointment in 2-3 months. Recheck LFT in 4 weeks The letter was only to inform him regarding the biopsies and recall colonoscopy. Thanks

## 2020-04-30 ENCOUNTER — Other Ambulatory Visit: Payer: Self-pay

## 2020-04-30 DIAGNOSIS — R7989 Other specified abnormal findings of blood chemistry: Secondary | ICD-10-CM

## 2020-04-30 NOTE — Telephone Encounter (Signed)
Yes, he was diagnosed as UC by previous GI based on prior colonoscopy. Thanks

## 2020-04-30 NOTE — Telephone Encounter (Signed)
Patient advised. Declines to schedule at this time but plans to call back. Order for LFT entered.

## 2020-04-30 NOTE — Telephone Encounter (Signed)
Does his diagnosis still include UC?

## 2020-05-23 ENCOUNTER — Ambulatory Visit: Payer: BC Managed Care – PPO

## 2020-05-30 ENCOUNTER — Other Ambulatory Visit: Payer: Self-pay

## 2020-05-30 ENCOUNTER — Ambulatory Visit (INDEPENDENT_AMBULATORY_CARE_PROVIDER_SITE_OTHER): Payer: BC Managed Care – PPO | Admitting: Neurology

## 2020-05-30 ENCOUNTER — Encounter: Payer: Self-pay | Admitting: Neurology

## 2020-05-30 VITALS — BP 109/75 | HR 69 | Ht 67.0 in | Wt 166.0 lb

## 2020-05-30 DIAGNOSIS — R430 Anosmia: Secondary | ICD-10-CM | POA: Diagnosis not present

## 2020-05-30 DIAGNOSIS — R413 Other amnesia: Secondary | ICD-10-CM | POA: Insufficient documentation

## 2020-05-30 DIAGNOSIS — J1282 Pneumonia due to coronavirus disease 2019: Secondary | ICD-10-CM

## 2020-05-30 DIAGNOSIS — G4719 Other hypersomnia: Secondary | ICD-10-CM

## 2020-05-30 DIAGNOSIS — Z8616 Personal history of COVID-19: Secondary | ICD-10-CM | POA: Insufficient documentation

## 2020-05-30 DIAGNOSIS — U071 COVID-19: Secondary | ICD-10-CM | POA: Diagnosis not present
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
## 2020-05-30 NOTE — Patient Instructions (Signed)

## 2020-06-08 LAB — NARCOLEPSY EVALUATION
DQA1*01:02: NEGATIVE
DQB1*06:02: NEGATIVE

## 2020-06-08 NOTE — Progress Notes (Signed)
Negative HLA narcolepsy factor.

## 2020-06-11 ENCOUNTER — Telehealth: Payer: Self-pay | Admitting: Neurology

## 2020-06-11 NOTE — Telephone Encounter (Signed)
-----  Message from Larey Seat, MD sent at 06/08/2020  1:55 PM EDT ----- Negative HLA narcolepsy factor.

## 2020-06-11 NOTE — Telephone Encounter (Signed)
Called the patient to advise of the lab results being negative for carrying the narcolepsy gene. Pt verbalized understanding. He wanted me to note after reviewing the office notes there was some things he wanted to make sure was mentioned. He wanted it to be known that he has not fell asleep at work. He mentioned falling asleep on the way to work but that was when he had pna. He states that he has been keeping a journal of the Headaches. Where initially he stated they come and go intermittently he is noticing they are occurring more often, about every other day.  Advised that based off sleep scale score in the office visit it would still be beneficial to move forward with the sleep test. Pt verbalized understanding.

## 2020-06-13 ENCOUNTER — Telehealth: Payer: Self-pay | Admitting: Gastroenterology

## 2020-06-13 NOTE — Telephone Encounter (Signed)
Yes the lab orders are there. He also needs to schedule his follow up appointment with Dr Silverio Decamp. Hulen Skains the patient. No answer.

## 2020-06-13 NOTE — Telephone Encounter (Signed)
Pt is checking to see if he is able to come by and have labs done, pt wants to make sure orders are on file.

## 2020-07-05 ENCOUNTER — Other Ambulatory Visit (INDEPENDENT_AMBULATORY_CARE_PROVIDER_SITE_OTHER): Payer: BC Managed Care – PPO

## 2020-07-05 DIAGNOSIS — R7989 Other specified abnormal findings of blood chemistry: Secondary | ICD-10-CM

## 2020-07-05 DIAGNOSIS — K51919 Ulcerative colitis, unspecified with unspecified complications: Secondary | ICD-10-CM | POA: Diagnosis not present

## 2020-07-05 LAB — IBC + FERRITIN
Ferritin: 117 ng/mL (ref 22.0–322.0)
Iron: 79 ug/dL (ref 42–165)
Saturation Ratios: 19.9 % — ABNORMAL LOW (ref 20.0–50.0)
Transferrin: 283 mg/dL (ref 212.0–360.0)

## 2020-07-05 LAB — HEPATIC FUNCTION PANEL
ALT: 78 U/L — ABNORMAL HIGH (ref 0–53)
AST: 48 U/L — ABNORMAL HIGH (ref 0–37)
Albumin: 4.4 g/dL (ref 3.5–5.2)
Alkaline Phosphatase: 104 U/L (ref 39–117)
Bilirubin, Direct: 0.1 mg/dL (ref 0.0–0.3)
Total Bilirubin: 0.8 mg/dL (ref 0.2–1.2)
Total Protein: 7.8 g/dL (ref 6.0–8.3)

## 2020-07-09 ENCOUNTER — Telehealth: Payer: Self-pay

## 2020-07-09 NOTE — Telephone Encounter (Signed)
LVM for pt to call me back to schedule sleep study  

## 2020-07-16 ENCOUNTER — Telehealth: Payer: Self-pay

## 2020-07-16 NOTE — Telephone Encounter (Signed)
We have attempted to call the patient two times to schedule sleep study.  Patient has been unavailable at the phone numbers we have on file and has not returned our calls. If patient calls back we will schedule them for their sleep study.  

## 2020-08-31 ENCOUNTER — Encounter (HOSPITAL_COMMUNITY): Payer: Self-pay

## 2020-08-31 ENCOUNTER — Ambulatory Visit (HOSPITAL_COMMUNITY)
Admission: EM | Admit: 2020-08-31 | Discharge: 2020-08-31 | Disposition: A | Discharge status: Home / Self Care | Payer: BC Managed Care – PPO | Attending: Emergency Medicine | Admitting: Emergency Medicine

## 2020-08-31 ENCOUNTER — Ambulatory Visit (INDEPENDENT_AMBULATORY_CARE_PROVIDER_SITE_OTHER): Payer: BC Managed Care – PPO

## 2020-08-31 ENCOUNTER — Other Ambulatory Visit: Payer: Self-pay

## 2020-08-31 DIAGNOSIS — M79672 Pain in left foot: Secondary | ICD-10-CM

## 2020-08-31 MED ORDER — IBUPROFEN 600 MG PO TABS: 600.0000 mg | ORAL_TABLET | Freq: Four times a day (QID) | ORAL | 0 refills | Status: DC | PRN

## 2020-08-31 NOTE — ED Provider Notes (Signed)
HPI  SUBJECTIVE:  Justin Barrera is a 44 y.o. male who presents with acute onset of atraumatic left lateral foot pain starting last night.  He describes it as throbbing, dull, constant, and becomes sharp with weightbearing.  Reports foot swelling starting last night.  States that he couldn't move his toes at all last night due to pain.  States that it is very sensitive to touch.  No erythema, other color changes, bruise, change in physical activity, new shoes, recent running or walking.  No pain with heel strike.  No leg, ankle, back pain.  He has never had symptoms like this before.  He tried massage which made things worse, ice, elevation, IcyHot and THC cream without improvement in his symptoms.  He has a past medical history of ulcerative colitis, but has not been on frequent chronic steroids.  He has a history of COVID, asthma. no history of gout, osteoporosis, diabetes, neuropathy, peripheral territorial/peripheral vascular disease.  VOZ:DGUYQIH, Frederico Hamman, MD   Past Medical History:  Diagnosis Date  . Anxiety   . Asthma   . COVID-19 10/2019  . Elevated LFTs   . Pure hypercholesterolemia   . Ulcerative colitis, unspecified   . Vertigo     Past Surgical History:  Procedure Laterality Date  . COLONOSCOPY  2008  . MOUTH SURGERY    . VASECTOMY      Family History  Adopted: Yes    Social History   Tobacco Use  . Smoking status: Former Smoker    Types: Cigarettes    Quit date: 09/01/1997    Years since quitting: 23.0  . Smokeless tobacco: Never Used  . Tobacco comment: 20 year ago  Vaping Use  . Vaping Use: Every day  . Substances: THC  . Devices: THC  Substance Use Topics  . Alcohol use: Not Currently    Comment: occassionally    No current facility-administered medications for this encounter.  Current Outpatient Medications:  .  ibuprofen (ADVIL) 600 MG tablet, Take 1 tablet (600 mg total) by mouth every 6 (six) hours as needed., Disp: 30 tablet, Rfl: 0 .  albuterol  (PROVENTIL HFA) 108 (90 Base) MCG/ACT inhaler, Inhale 2 puffs into the lungs every 6 (six) hours as needed for wheezing or shortness of breath (coughing fit)., Disp: 18 g, Rfl: 2 .  beclomethasone (QVAR) 80 MCG/ACT inhaler, Inhale 1 puff into the lungs 2 (two) times daily., Disp: 1 Inhaler, Rfl: 1 .  Cannabinoids (THC FREE PO), Take by mouth daily. Vape, Disp: , Rfl:   Allergies  Allergen Reactions  . Latex Anaphylaxis  . Iohexol Itching    Pt sneezed several times within 90 seconds post injection. Developed itching in roof of mouth.      ROS  As noted in HPI.   Physical Exam  BP 123/84 (BP Location: Right Arm)   Pulse 75   Temp 98.3 F (36.8 C) (Oral)   Resp 18   SpO2 98%   Constitutional: Well developed, well nourished, no acute distress Eyes:  EOMI, conjunctiva normal bilaterally HENT: Normocephalic, atraumatic,mucus membranes moist Respiratory: Normal inspiratory effort Cardiovascular: Normal rate GI: nondistended skin: No rash, skin intact Musculoskeletal:  Left midfoot NT.  Tenderness along metatarsals, especially at the 4th metatarsal.  Skin intact.  Foot warm, pink.  Base of fifth metatarsal NT. No bruising.  DP 2+. Sensation grossly intact. Patient able to move all toes actively.  no pain with inversion / eversion, pain with dorsiflexion / plantarflexion.  Tenderness in the mid  arch of the foot.  No other  Tenderness along the plantar fascia. Distal fibula NT, Medial malleolus NT,  Deltoid ligament NT, Lateral ligaments NT, Achilles NT, calcaneus nontender. Patient unable to bear weight while in department. Neurologic: Alert & oriented x 3, no focal neuro deficits Psychiatric: Speech and behavior appropriate   ED Course   Medications - No data to display  Orders Placed This Encounter  Procedures  . DG Foot Complete Left    Standing Status:   Standing    Number of Occurrences:   1    Order Specific Question:   Reason for Exam (SYMPTOM  OR DIAGNOSIS REQUIRED)     Answer:   Tenderness along metatarsals, especially at the 4th metatarsal, rule out fracture.  . Crutches    Standing Status:   Standing    Number of Occurrences:   1    No results found for this or any previous visit (from the past 24 hour(s)). DG Foot Complete Left  Result Date: 08/31/2020 CLINICAL DATA:  Pain of the left foot since last night. EXAM: LEFT FOOT - COMPLETE 3+ VIEW COMPARISON:  None. FINDINGS: There is no evidence of fracture or dislocation. There is no evidence of arthropathy or other focal bone abnormality. Soft tissues are unremarkable. IMPRESSION: Negative. Electronically Signed   By: Abelardo Diesel M.D.   On: 08/31/2020 10:29    ED Clinical Impression  1. Foot pain, left      ED Assessment/Plan  will get x-ray to rule out stress fracture because of the tenderness maximal at the 4th metatarsal..  Does not appear to be infection, gout, acute vascular issue.   Reviewed imaging independently.  Normal X-ray.  See radiology report for full details.   films are negative for fracture, dislocation.   will send home with crutches, continue rest, ice, elevation, ibuprofen/Tylenol and follow[facility]up [facility] if not better in a week with conservative therapy.  Discussed imaging, MDM, treatment plan, and plan for follow-up with patient.. patient agrees with plan.   Meds ordered this encounter  Medications  . ibuprofen (ADVIL) 600 MG tablet    Sig: Take 1 tablet (600 mg total) by mouth every 6 (six) hours as needed.    Dispense:  30 tablet    Refill:  0    *This clinic note was created using [employer]. Therefore, there may be occasional mistakes despite careful proofreading.   ?    Melynda Ripple, MD 09/02/20 [phone]

## 2020-08-31 NOTE — Discharge Instructions (Addendum)
Your x-ray was negative for a fracture.  I am unsure as to the cause of your symptoms but does not appear to be gout, infection, or circulation problem.  Use the crutches as needed for comfort.  Take 600 mg of ibuprofen combined with 1000 mg of Tylenol together 3-4 times a day as needed.  Continue ice, rest, elevation.  Follow[facility]up [facility] if not better in a week.

## 2020-08-31 NOTE — ED Triage Notes (Signed)
Pt presents with pain in the left foot since last night. Repost he was lay down watching tv, felt a cramp and started having pain in he left foot. Reports he can not put weight on the left foot.

## 2021-12-18 ENCOUNTER — Encounter: Payer: Self-pay | Admitting: Family Medicine

## 2021-12-19 NOTE — Telephone Encounter (Signed)
Check [place] medical board. ?

## 2021-12-25 NOTE — Telephone Encounter (Signed)
Left message for Justin Barrera that his paperwork is ready to be picked up at the front desk ? ?

## 2022-12-17 IMAGING — DX DG FOOT COMPLETE 3+V*L*
3 series · 3 of 3 positions shown · non-contrast
Comparison: None.

CLINICAL DATA: Pain of the left foot since last night.

EXAM:
LEFT FOOT - COMPLETE 3+ VIEW

[foot ap]
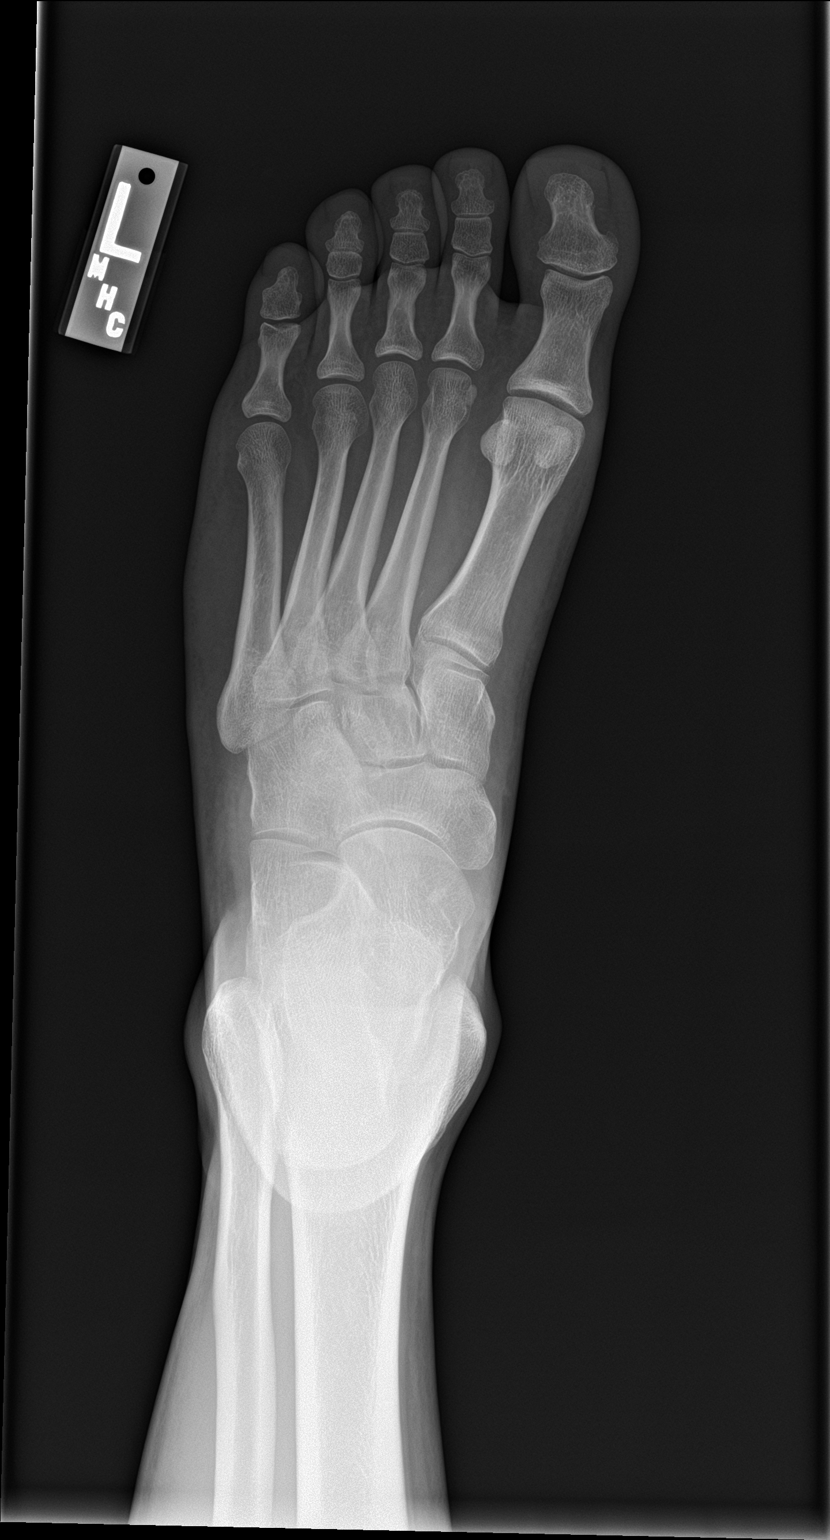

[foot obl]
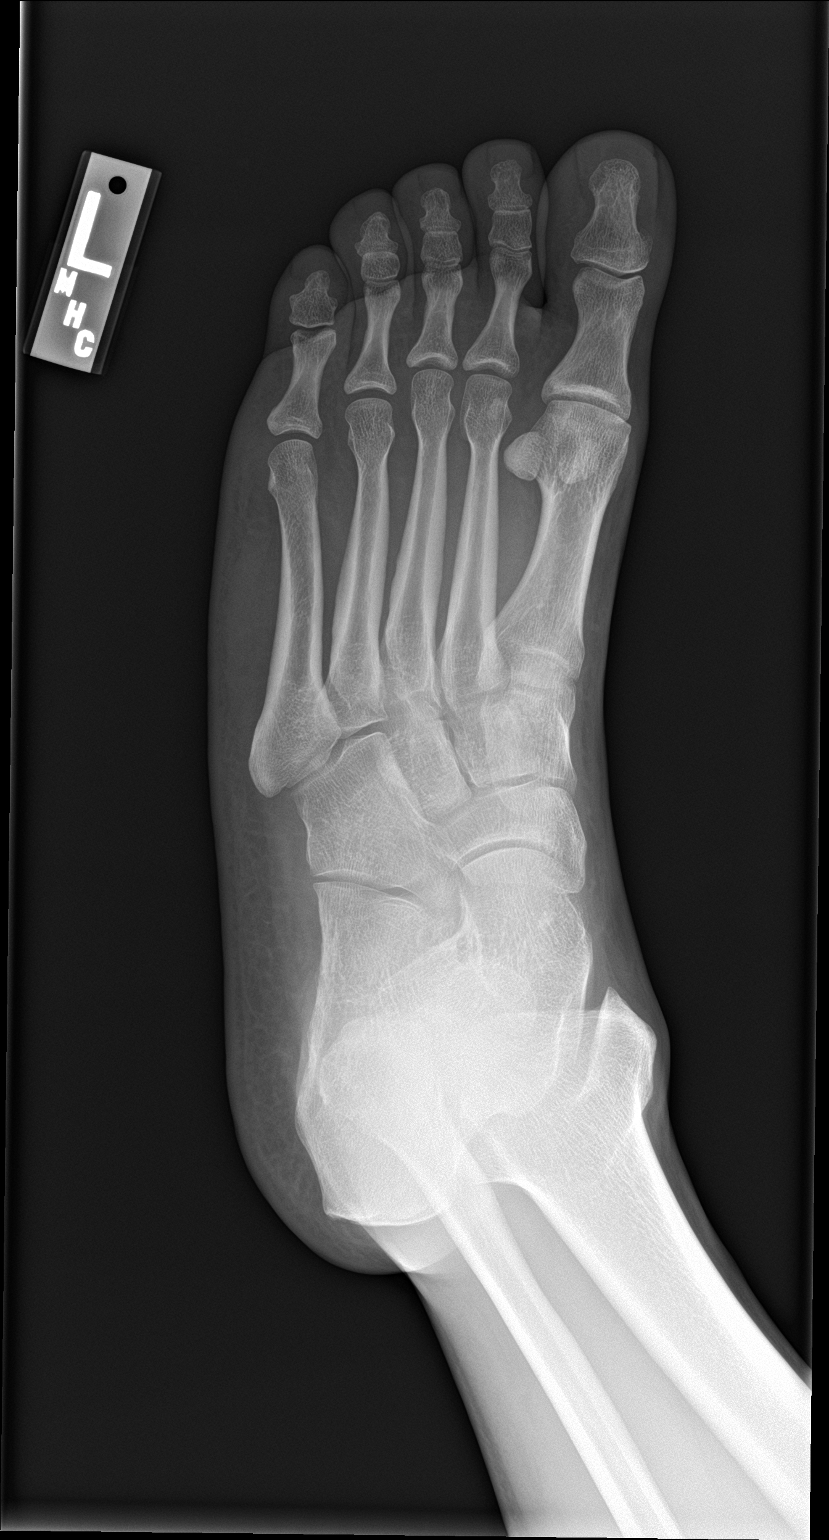

[foot lat]
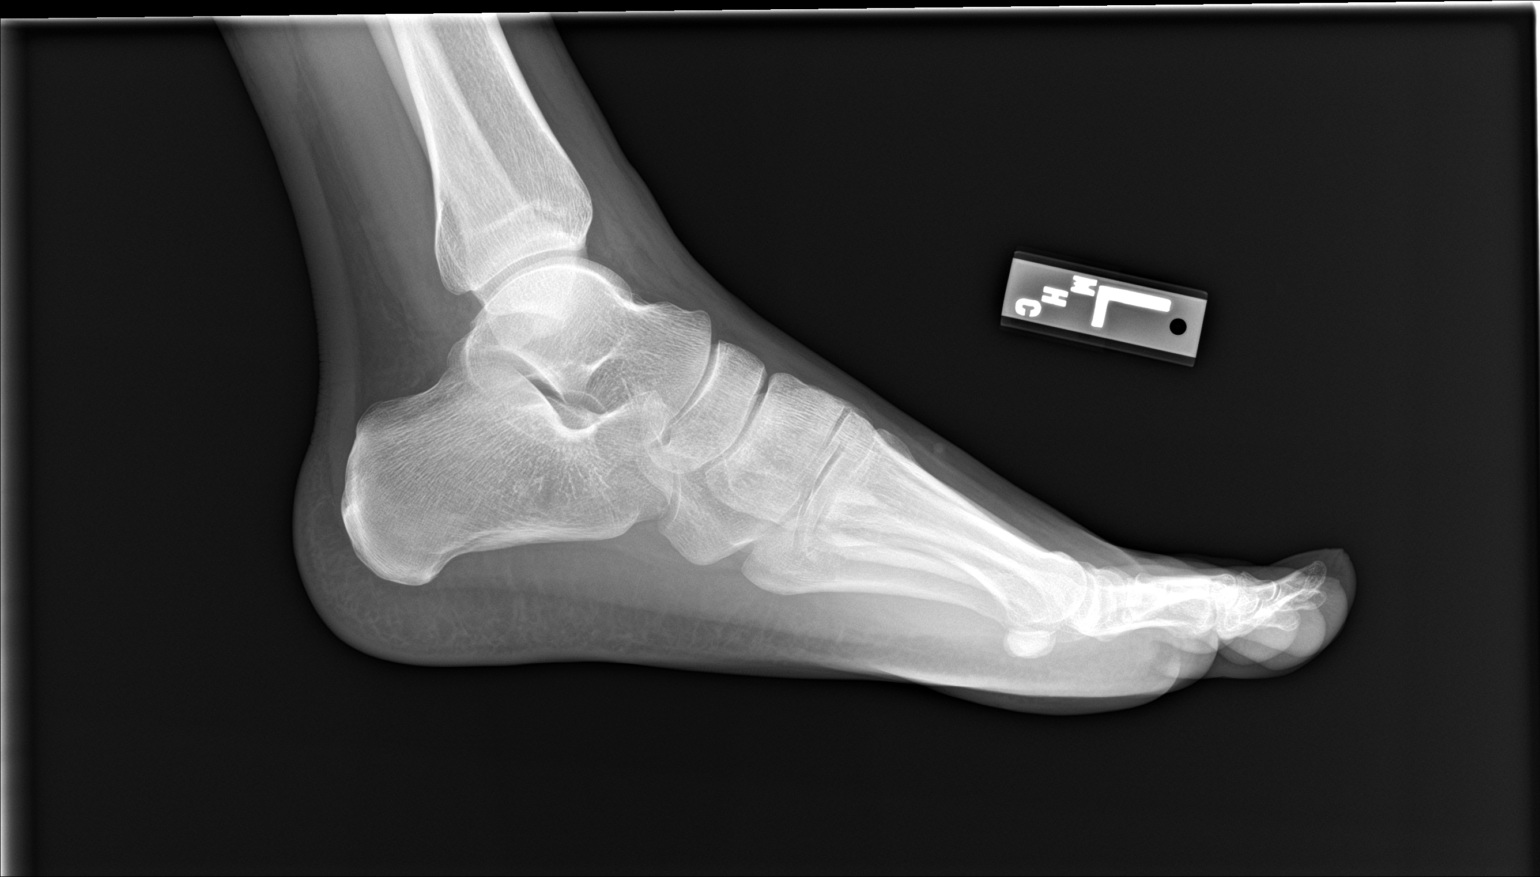

[3 of 3 positions shown; findings below may reference images not displayed]

FINDINGS: There is no evidence of fracture or dislocation. There is no
evidence of arthropathy or other focal bone abnormality. Soft
tissues are unremarkable.
IMPRESSION: Negative.

## 2023-02-24 ENCOUNTER — Encounter: Payer: Self-pay | Admitting: Family Medicine

## 2023-02-24 DIAGNOSIS — K760 Fatty (change of) liver, not elsewhere classified: Secondary | ICD-10-CM | POA: Insufficient documentation

## 2023-02-24 NOTE — Progress Notes (Unsigned)
[name] [name], MD, CAQ Sports Medicine [facility] at [facility] [address] [place], [address]  Phone: [phone]  FAX: [phone]  Justin Barrera - 47 y.o. male  MRN [id number]  Date of Birth: June 15, 1976  Date: 02/25/2023  PCP: Hannah Beat, MD  Referral: Hannah Beat, MD  No chief complaint on file.  Patient Care Team: Hannah Beat, MD as PCP - General Subjective:   Justin Barrera is a 47 y.o. pleasant patient who presents with the following:  Preventative Health Maintenance Visit:  Health Maintenance Summary Reviewed and updated, unless pt declines services.  Tobacco History Reviewed. Alcohol: No concerns, no excessive use Exercise Habits: Some activity, rec at least 30 mins 5 times a week STD concerns: no risk or activity to increase risk Drug Use: None  He is here for follow-up, and I actually have not seen him in 3 years.  It does not look like he has seen any other providers either.  He does have ulcerative colitis, but it looks like he has been lost to GI follow-up.  He also in the past has had some elevated LFTs.  Health Maintenance  Topic Date Due   COVID-19 Vaccine (1) Never done   DTaP/Tdap/Td (2 - Td or Tdap) 11/16/2021   Colonoscopy  04/13/2023   INFLUENZA VACCINE  04/02/2023   Hepatitis C Screening  Completed   HIV Screening  Completed   HPV VACCINES  Aged Out   Immunization History  Administered Date(s) Administered   Influenza-Unspecified 06/17/2016, 05/02/2017   Tdap 11/17/2011   Patient Active Problem List   Diagnosis Date Noted   Ulcerative colitis (HCC) 04/04/2009    Priority: Medium    Excessive daytime sleepiness 05/30/2020   Cough variant asthma 03/16/2015   HYPERCHOLESTEROLEMIA 04/04/2009   Elevated LFTs 04/04/2009    Past Medical History:  Diagnosis Date   Anxiety    Asthma    Elevated LFTs    Pure hypercholesterolemia    Ulcerative colitis, unspecified     Past  Surgical History:  Procedure Laterality Date   COLONOSCOPY  2008   MOUTH SURGERY     VASECTOMY      Family History  Adopted: Yes    Social History   Social History Narrative   Not on file    Past Medical History, Surgical History, Social History, Family History, Problem List, Medications, and Allergies have been reviewed and updated if relevant.  Review of Systems: Pertinent positives are listed above.  Otherwise, a full 14 point review of systems has been done in full and it is negative except where it is noted positive.  Objective:   There were no vitals taken for this visit. Ideal Body Weight:    Ideal Body Weight:   No results found.    12/21/2019   10:56 AM 10/09/2016    8:21 AM  Depression screen PHQ 2/9  Decreased Interest 3 0  Down, Depressed, Hopeless 2 0  PHQ - 2 Score 5 0     GEN: well developed, well nourished, no acute distress Eyes: conjunctiva and lids normal, PERRLA, EOMI ENT: TM clear, nares clear, oral exam WNL Neck: supple, no lymphadenopathy, no thyromegaly, no JVD Pulm: clear to auscultation and percussion, respiratory effort normal CV: regular rate and rhythm, S1-S2, no murmur, rub or gallop, no bruits, peripheral pulses normal and symmetric, no cyanosis, clubbing, edema or varicosities GI: soft, non-tender; no hepatosplenomegaly, masses; active bowel sounds all quadrants GU: deferred Lymph: no cervical, axillary  or inguinal adenopathy MSK: gait normal, muscle tone and strength WNL, no joint swelling, effusions, discoloration, crepitus  SKIN: clear, good turgor, color WNL, no rashes, lesions, or ulcerations Neuro: normal mental status, normal strength, sensation, and motion Psych: alert; oriented to person, place and time, normally interactive and not anxious or depressed in appearance.  All labs reviewed with patient. Results for orders placed or performed in visit on 07/05/20  Hepatic function panel  Result Value Ref Range   Total  Bilirubin 0.8 0.2 - 1.2 mg/dL   Bilirubin, Direct 0.1 0.0 - 0.3 mg/dL   Alkaline Phosphatase 104 39 - 117 U/L   AST 48 (H) 0 - 37 U/L   ALT 78 (H) 0 - 53 U/L   Total Protein 7.8 6.0 - 8.3 g/dL   Albumin 4.4 3.5 - 5.2 g/dL  IBC + Ferritin  Result Value Ref Range   Iron 79 42 - 165 ug/dL   Transferrin 147.8 295.6 - 360.0 mg/dL   Saturation Ratios 21.3 (L) 20.0 - 50.0 %   Ferritin 117.0 22.0 - 322.0 ng/mL    Assessment and Plan:     ICD-10-CM   1. Healthcare maintenance  Z00.00       Health Maintenance Exam: The patient's preventative maintenance and recommended screening tests for an annual wellness exam were reviewed in full today. Brought up to date unless services declined.  Counselled on the importance of diet, exercise, and its role in overall health and mortality. The patient's FH and SH was reviewed, including their home life, tobacco status, and drug and alcohol status.  Follow-up in 1 year for physical exam or additional follow-up below.  Disposition: No follow-ups on file.  No orders of the defined types were placed in this encounter.  There are no discontinued medications. No orders of the defined types were placed in this encounter.   Signed,  Elpidio Galea. Alica Shellhammer, MD   Allergies as of 02/25/2023       Reactions   Latex Anaphylaxis   Iohexol Itching   Pt sneezed several times within 90 seconds post injection. Developed itching in roof of mouth.         Medication List        Accurate as of February 24, 2023 10:28 AM. If you have any questions, ask your nurse or doctor.          albuterol 108 (90 Base) MCG/ACT inhaler Commonly known as: Proventil HFA Inhale 2 puffs into the lungs every 6 (six) hours as needed for wheezing or shortness of breath (coughing fit).   ibuprofen 600 MG tablet Commonly known as: ADVIL Take 1 tablet (600 mg total) by mouth every 6 (six) hours as needed.   Qvar 80 MCG/ACT inhaler Generic drug: beclomethasone Inhale 1  puff into the lungs 2 (two) times daily.   THC FREE PO Take by mouth daily. Vape

## 2023-02-25 ENCOUNTER — Encounter: Payer: Self-pay | Admitting: Family Medicine

## 2023-02-25 ENCOUNTER — Ambulatory Visit: Payer: BC Managed Care – PPO | Admitting: Family Medicine

## 2023-02-25 VITALS — BP 90/60 | HR 67 | Temp 97.7°F | Ht 67.0 in | Wt 153.1 lb

## 2023-02-25 DIAGNOSIS — Z23 Encounter for immunization: Secondary | ICD-10-CM

## 2023-02-25 DIAGNOSIS — Z125 Encounter for screening for malignant neoplasm of prostate: Secondary | ICD-10-CM

## 2023-02-25 DIAGNOSIS — R7989 Other specified abnormal findings of blood chemistry: Secondary | ICD-10-CM | POA: Diagnosis not present

## 2023-02-25 DIAGNOSIS — Z131 Encounter for screening for diabetes mellitus: Secondary | ICD-10-CM

## 2023-02-25 DIAGNOSIS — Z13220 Encounter for screening for lipoid disorders: Secondary | ICD-10-CM | POA: Diagnosis not present

## 2023-02-25 DIAGNOSIS — Z79899 Other long term (current) drug therapy: Secondary | ICD-10-CM | POA: Diagnosis not present

## 2023-02-25 DIAGNOSIS — K760 Fatty (change of) liver, not elsewhere classified: Secondary | ICD-10-CM

## 2023-02-25 DIAGNOSIS — Z0000 Encounter for general adult medical examination without abnormal findings: Secondary | ICD-10-CM | POA: Diagnosis not present

## 2023-02-25 LAB — CBC WITH DIFFERENTIAL/PLATELET
Basophils Absolute: 0.1 10*3/uL (ref 0.0–0.1)
Basophils Relative: 0.8 % (ref 0.0–3.0)
Eosinophils Absolute: 0.1 10*3/uL (ref 0.0–0.7)
Eosinophils Relative: 2.2 % (ref 0.0–5.0)
HCT: 46.2 % (ref 39.0–52.0)
Hemoglobin: 15.2 g/dL (ref 13.0–17.0)
Lymphocytes Relative: 29.4 % (ref 12.0–46.0)
Lymphs Abs: 1.9 10*3/uL (ref 0.7–4.0)
MCHC: 32.8 g/dL (ref 30.0–36.0)
MCV: 82.4 fl (ref 78.0–100.0)
Monocytes Absolute: 0.7 10*3/uL (ref 0.1–1.0)
Monocytes Relative: 10.6 % (ref 3.0–12.0)
Neutro Abs: 3.7 10*3/uL (ref 1.4–7.7)
Neutrophils Relative %: 57 % (ref 43.0–77.0)
Platelets: 226 10*3/uL (ref 150.0–400.0)
RBC: 5.61 Mil/uL (ref 4.22–5.81)
RDW: 14.2 % (ref 11.5–15.5)
WBC: 6.5 10*3/uL (ref 4.0–10.5)

## 2023-02-25 LAB — HEPATIC FUNCTION PANEL
ALT: 27 U/L (ref 0–53)
AST: 23 U/L (ref 0–37)
Albumin: 4.5 g/dL (ref 3.5–5.2)
Alkaline Phosphatase: 99 U/L (ref 39–117)
Bilirubin, Direct: 0.1 mg/dL (ref 0.0–0.3)
Total Bilirubin: 0.6 mg/dL (ref 0.2–1.2)
Total Protein: 7.5 g/dL (ref 6.0–8.3)

## 2023-02-25 LAB — BASIC METABOLIC PANEL
BUN: 15 mg/dL (ref 6–23)
CO2: 31 mEq/L (ref 19–32)
Calcium: 9.3 mg/dL (ref 8.4–10.5)
Chloride: 102 mEq/L (ref 96–112)
Creatinine, Ser: 1 mg/dL (ref 0.40–1.50)
GFR: 89.45 mL/min (ref >60.00)
Glucose, Bld: 100 mg/dL — ABNORMAL HIGH (ref 70–99)
Potassium: 3.8 mEq/L (ref 3.5–5.1)
Sodium: 137 mEq/L (ref 135–145)

## 2023-02-25 LAB — LIPID PANEL
Cholesterol: 266 mg/dL — ABNORMAL HIGH (ref 0–200)
HDL: 34.9 mg/dL — ABNORMAL LOW (ref >39.00)
LDL Cholesterol: 191 mg/dL — ABNORMAL HIGH (ref 0–99)
NonHDL: 231.12
Total CHOL/HDL Ratio: 8
Triglycerides: 199 mg/dL — ABNORMAL HIGH (ref 0.0–149.0)
VLDL: 39.8 mg/dL (ref 0.0–40.0)

## 2023-02-25 LAB — HEMOGLOBIN A1C: Hgb A1c MFr Bld: 5.5 % (ref 4.6–6.5)

## 2023-02-25 NOTE — Patient Instructions (Addendum)
Coal tar shampoo - Justin Barrera is the strongest T-gel is the most commonly used shampoo - ideally, 5% is the strongest - Psoriatrax

## 2023-02-27 LAB — PSA, TOTAL WITH REFLEX TO PSA, FREE: PSA, Total: 1 ng/mL (ref <=4.0)

## 2023-07-16 ENCOUNTER — Encounter: Payer: Self-pay | Admitting: Gastroenterology

## 2023-09-07 ENCOUNTER — Ambulatory Visit: Payer: 59 | Admitting: Nurse Practitioner

## 2023-09-07 ENCOUNTER — Encounter: Payer: Self-pay | Admitting: Nurse Practitioner

## 2023-09-07 VITALS — BP 100/64 | HR 72 | Ht 67.0 in | Wt 153.4 lb

## 2023-09-07 DIAGNOSIS — K5180 Other ulcerative colitis without complications: Secondary | ICD-10-CM | POA: Diagnosis not present

## 2023-09-07 DIAGNOSIS — R109 Unspecified abdominal pain: Secondary | ICD-10-CM

## 2023-09-07 DIAGNOSIS — R194 Change in bowel habit: Secondary | ICD-10-CM | POA: Diagnosis not present

## 2023-09-07 MED ORDER — NA SULFATE-K SULFATE-MG SULF 17.5-3.13-1.6 GM/177ML PO SOLN
1.0000 | ORAL | 0 refills | Status: AC
Start: 2023-09-07 — End: ?

## 2023-09-07 NOTE — Patient Instructions (Signed)
 _______________________________________________________  If your blood pressure at your visit was 140/90 or greater, please contact your primary care physician to follow up on this.  If you are age 49 or younger, your body mass index should be between 19-25. Your Body mass index is 24.02 kg/m. If this is out of the aformentioned range listed, please consider follow up with your Primary Care Provider.  ________________________________________________________  The  GI providers would like to encourage you to use MYCHART to communicate with providers for non-urgent requests or questions.  Due to long hold times on the telephone, sending your provider a message by [facility] may be a faster and more efficient way to get a response.  Please allow 48 business hours for a response.  Please remember that this is for non-urgent requests.  _______________________________________________________  Rosine have been scheduled for a colonoscopy. Please follow written instructions given to you at your visit today.   Please pick up your prep supplies at the pharmacy within the next 1-3 days.  If you use inhalers (even only as needed), please bring them with you on the day of your procedure.  DO NOT TAKE 7 DAYS PRIOR TO TEST- Trulicity (dulaglutide) Ozempic, Wegovy (semaglutide) Mounjaro (tirzepatide) Bydureon Bcise (exanatide extended release)  DO NOT TAKE 1 DAY PRIOR TO YOUR TEST Rybelsus (semaglutide) Adlyxin (lixisenatide) Victoza (liraglutide) Byetta (exanatide) ___________________________________________________________________________  Due to recent changes in healthcare laws, you may see the results of your imaging and laboratory studies on MyChart before your provider has had a chance to review them.  We understand that in some cases there may be results that are confusing or concerning to you. Not all laboratory results come back in the same time frame and the provider may be waiting for  multiple results in order to interpret others.  Please give us  48 hours in order for your provider to thoroughly review all the results before contacting the office for clarification of your results.   Thank you for entrusting me with your care and choosing [facility].  Vina Dasen, NP

## 2023-09-07 NOTE — Progress Notes (Signed)
 Brief Narrative 48 y.o. yo male known to Dr. Shila with a past medical history not limited to asthma, ? ulcerative colitis (diagnosed in 2008)   ASSESSMENT    History of mild active colitis diagnosed in 2008  NSAID induced? IBD?  No symptoms off medication for years. No IBD on last colonoscopy with biopsies in 2021 but a three year colonoscopy was recommended  Chronic, intermittent abdominal cramping and chronically altered bowel habits.  Stool consistency varies from liquid to hard stools with constipation.  IBS?  Has he developed active IBD?   See PMH for any additional medical history   PLAN   --Schedule for the 3 year recommended colonoscopy. The risks and benefits of colonoscopy with possible polypectomy / biopsies were discussed and the patient agrees to proceed. If repeat colonoscopy is negative then perhaps his symptoms are functional  / IBS related and he doesn't have IBD.    HPI   Chief complaint :  time for surveillance colonoscopy   Brief GI History: Patient was last seen here in 2021.  He has a history of ulcerative colitis diagnosed in 2008.  Sounds like he had resolution of symptoms on mesalamine which was eventually discontinued.  Patient established care with us  in July 2021 for evaluation of elevated LFTs and colitis symptoms.  He underwent a colonoscopy which did not show any active IBD.  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  He had a complete hepatic serologic workup.  CT scan suggested hepatic steatosis.  Patient returns today as he is due for surveillance colonoscopy.  Of note, his LFTs in June 2024 were normal  Interval History:  We mailed Justin Barrera a letter in November to let him know it was time for his colonoscopy and asked him to come into the office to discuss .   Justin Barrera has chronic intermittent generalized abdominal cramping.  His stools vary widely in consistency from loose to hard  with constipation.  He averages about 2-3 BMs a day .  About once a week he gets constipated with small pieces of hard stool. He treats this by eating ice cream because he is lactose intolerant.  About once a month he has a bad flare up  with urgent diarrhea containing mucous. These episodes last a few days.  He does not see any blood in his stools  He does use THC on a daily basis   GI History / Studies   **May not be a complete list of studies  Aug 2021 surveillance colonoscopy ( longstanding UC) --one < 1 mm descending colon polyps. Normal mucosa in entire colon, biopsied. Diverticulosis, in sigmoid and ascending colon  Non-bleeding internal hemorrhoids.   Diagnosis 1. Surgical [P], right colon biopsies - COLONIC MUCOSA WITH NO SIGNIFICANT HISTOPATHOLOGIC CHANGES. - NO ACTIVE INFLAMMATION OR GRANULOMAS. - NEGATIVE FOR DYSPLASIA. 2. Surgical [P], left colon biopsies - COLONIC MUCOSA WITH NO SIGNIFICANT HISTOPATHOLOGIC CHANGES. - NO ACTIVE INFLAMMATION OR GRANULOMAS. - NEGATIVE FOR DYSPLASIA. 3. Surgical [P], colon, descending, polyp - HYPERPLASTIC POLYP. - NO ADENOMATOUS CHANGE OR CARCINOMA.  3 year follow up colonoscopy recommended.    Colonoscopy Feb 2008 per Elida Nyle Sharps, NP note on 03/14/20 Dr. Rollin .  Active mild colitis.  Biopsy results  confirmed mild active colitis which was nonspecific and classic features of IBD were not identified, the differential included self limited colitis verses NSAID use and less likely IBD.    Labs  Latest Ref Rng & Units 02/25/2023    9:28 AM 03/14/2020    9:40 AM 12/21/2019    1:14 PM  CBC  WBC 4.0 - 10.5 K/uL 6.5  6.1  9.8   Hemoglobin 13.0 - 17.0 g/dL 84.7  84.5  85.8   Hematocrit 39.0 - 52.0 % 46.2  46.6  40.8   Platelets 150.0 - 400.0 K/uL 226.0  211.0  252     No results found for: LIPASE    Latest Ref Rng & Units 02/25/2023    9:28 AM 07/05/2020   11:24 AM 03/14/2020    9:40 AM  CMP  Glucose 70 - 99 mg/dL  899   96   BUN 6 - 23 mg/dL 15   10   Creatinine 9.59 - 1.50 mg/dL 8.99   9.06   Sodium 864 - 145 mEq/L 137   139   Potassium 3.5 - 5.1 mEq/L 3.8   3.9   Chloride 96 - 112 mEq/L 102   102   CO2 19 - 32 mEq/L 31   29   Calcium 8.4 - 10.5 mg/dL 9.3   9.4   Total Protein 6.0 - 8.3 g/dL 7.5  7.8  7.9   Total Bilirubin 0.2 - 1.2 mg/dL 0.6  0.8  0.7   Alkaline Phos 39 - 117 U/L 99  104  99   AST 0 - 37 U/L 23  48  74   ALT 0 - 53 U/L 27  78  145       Past Medical History:  Diagnosis Date   Anxiety    Asthma    Depression    Elevated LFTs    Pure hypercholesterolemia    Ulcerative colitis, unspecified    Past Surgical History:  Procedure Laterality Date   COLONOSCOPY  2008   MOUTH SURGERY     VASECTOMY     Family History  Adopted: Yes  Problem Relation Age of Onset   Anxiety disorder Daughter    Depression Daughter    Social History   Tobacco Use   Smoking status: Former    Current packs/day: 0.00    Types: Cigarettes    Quit date: 09/01/1997    Years since quitting: 26.0   Smokeless tobacco: Never   Tobacco comments:    20 year ago  Vaping Use   Vaping status: Every Day   Substances: THC   Devices: THC  Substance Use Topics   Alcohol use: Not Currently    Comment: occassionally   Drug use: Yes    Types: Marijuana    Comment: vape thc   Current Outpatient Medications  Medication Sig Dispense Refill   albuterol  (PROVENTIL  HFA) 108 (90 Base) MCG/ACT inhaler Inhale 2 puffs into the lungs every 6 (six) hours as needed for wheezing or shortness of breath (coughing fit). 18 g 2   beclomethasone (QVAR ) 80 MCG/ACT inhaler Inhale 1 puff into the lungs 2 (two) times daily. 1 Inhaler 1   Cannabinoids (THC FREE PO) Take by mouth daily. Vape     No current facility-administered medications for this visit.   Allergies  Allergen Reactions   Latex Anaphylaxis   Iohexol  Itching    Pt sneezed several times within 90 seconds post injection. Developed itching in roof of  mouth.      Review of Systems: Positive for anxiety, cough, depression, fatigue, sleeping problems All other systems reviewed and negative except where noted in HPI.   Wt Readings from Last 3  Encounters:  09/07/23 153 lb 6 oz (69.6 kg)  02/25/23 153 lb 2 oz (69.5 kg)  05/30/20 166 lb (75.3 kg)    Physical Exam:  BP 100/64 (BP Location: Left Arm, Patient Position: Sitting, Cuff Size: Normal)   Pulse 72   Ht 5' 7 (1.702 m) Comment: height measured without shoes  Wt 153 lb 6 oz (69.6 kg)   BMI 24.02 kg/m  Constitutional:  Pleasant, generally well appearing male in no acute distress. Psychiatric:  Normal mood and affect. Behavior is normal. EENT: Pupils normal.  Conjunctivae are normal. No scleral icterus. Neck supple.  Cardiovascular: Normal rate, regular rhythm.  Pulmonary/chest: Effort normal and breath sounds normal. No wheezing, rales or rhonchi. Abdominal: Soft, nondistended, nontender. Bowel sounds active throughout. There are no masses palpable. No hepatomegaly. Neurological: Alert and oriented to person place and time.   Vina Dasen, NP  09/07/2023, 10:55 AM  Cc:  Referring Provider Watt Mirza, MD

## 2023-09-15 ENCOUNTER — Telehealth: Payer: Self-pay | Admitting: Nurse Practitioner

## 2023-09-15 NOTE — Telephone Encounter (Signed)
 Patient rescheduled and will need  new prep instructions.   Please advise.

## 2023-09-15 NOTE — Telephone Encounter (Signed)
 Patient aware that new prep instructions were sent to him in MyChart for his review.  Instructions were discussed by phone. Patient agreed to plan and verbalized understanding.  No further questions.

## 2023-10-06 ENCOUNTER — Emergency Department (HOSPITAL_COMMUNITY)
Admission: EM | Admit: 2023-10-06 | Discharge: 2023-10-06 | Disposition: A | Discharge status: Home / Self Care | Payer: 59 | Attending: Emergency Medicine | Admitting: Emergency Medicine

## 2023-10-06 ENCOUNTER — Emergency Department (HOSPITAL_COMMUNITY): Payer: 59

## 2023-10-06 ENCOUNTER — Ambulatory Visit: Payer: Self-pay | Admitting: Family Medicine

## 2023-10-06 ENCOUNTER — Other Ambulatory Visit: Payer: Self-pay

## 2023-10-06 ENCOUNTER — Encounter (HOSPITAL_COMMUNITY): Payer: Self-pay

## 2023-10-06 DIAGNOSIS — Z91040 Latex allergy status: Secondary | ICD-10-CM | POA: Insufficient documentation

## 2023-10-06 DIAGNOSIS — J45909 Unspecified asthma, uncomplicated: Secondary | ICD-10-CM | POA: Insufficient documentation

## 2023-10-06 DIAGNOSIS — R058 Other specified cough: Secondary | ICD-10-CM | POA: Diagnosis present

## 2023-10-06 DIAGNOSIS — Z20822 Contact with and (suspected) exposure to covid-19: Secondary | ICD-10-CM | POA: Diagnosis not present

## 2023-10-06 DIAGNOSIS — R0602 Shortness of breath: Secondary | ICD-10-CM | POA: Insufficient documentation

## 2023-10-06 DIAGNOSIS — R059 Cough, unspecified: Secondary | ICD-10-CM

## 2023-10-06 LAB — RESP PANEL BY RT-PCR (RSV, FLU A&B, COVID)  RVPGX2
Influenza A by PCR: NEGATIVE
Influenza B by PCR: NEGATIVE
Resp Syncytial Virus by PCR: NEGATIVE
SARS Coronavirus 2 by RT PCR: NEGATIVE

## 2023-10-06 MED ORDER — GUAIFENESIN 100 MG/5ML PO LIQD: 100.0000 mg | ORAL | 0 refills | Status: AC | PRN

## 2023-10-06 MED ORDER — ONDANSETRON 4 MG PO TBDP: 4.0000 mg | ORAL_TABLET | Freq: Once | ORAL | Status: DC

## 2023-10-06 MED ORDER — IBUPROFEN 800 MG PO TABS
800.0000 mg | ORAL_TABLET | Freq: Once | ORAL | Status: AC
  Administered 2023-10-06: 800 mg via ORAL
  Filled 2023-10-06: qty 1

## 2023-10-06 NOTE — Telephone Encounter (Signed)
 Summary: sick, afraid it's pneumonia   Copied From CRM [id number]. Reason for Triage: patient is getting really sick and thinks it's starting to build the same way as when he had pneumonia last year. Not sure what to do- if he should come in or if there is a certain medication he can take. Please advise with patient.          Chief Complaint: cough x 1.5 weeks Symptoms: chest pain, mild shortness of breath Frequency: constant Pertinent Negatives: Patient denies fever Disposition: [x] ED /[] Urgent Care (no appt availability in office) / [] Appointment(In office/virtual)/ []  [facility] [facility]/ [] Home Care/ [] Refused Recommended Disposition /[] [place] [facility]/ []  Follow-up with PCP Additional Notes: patient reports cough with chest pain and mild shortness of breath., States that he feels a heavy pressure on his chest even when he is not coughing. He also reports shortness of breath when trying to do household task like washing dishes.  Pt has hx of asthma after having COVID. RN advising ED. Pt agreeable, going to [facility] ED at [facility].   Reason for Disposition  Chest pain  (Exception: MILD central chest pain, present only when coughing.)  Answer Assessment - Initial Assessment Questions 1. ONSET: When did the cough begin?      1 week and a half  2. SEVERITY: How bad is the cough today?      Getting better but still coughing  3. SPUTUM: Describe the color of your sputum (none, dry cough; clear, white, yellow, green)     Mostly clear and yellow  4. HEMOPTYSIS: Are you coughing up any blood? If so ask: How much? (flecks, streaks, tablespoons, etc.)     No blood  5. DIFFICULTY BREATHING: Are you having difficulty breathing? If Yes, ask: How bad is it? (e.g., mild, moderate, severe)    - MILD: No SOB at rest, mild SOB with walking, speaks normally in sentences, can lie down, no retractions, pulse < 100.    - MODERATE: SOB at rest, SOB with minimal exertion and prefers  to sit, cannot lie down flat, speaks in phrases, mild retractions, audible wheezing, pulse 100-120.    - SEVERE: Very SOB at rest, speaks in single words, struggling to breathe, sitting hunched forward, retractions, pulse > 120      Mild shortness of breath  6. FEVER: Do you have a fever? If Yes, ask: What is your temperature, how was it measured, and when did it start?     No fever this week, was running fevers last week  7. CARDIAC HISTORY: Do you have any history of heart disease? (e.g., heart attack, congestive heart failure)      No heart disease  8. LUNG HISTORY: Do you have any history of lung disease?  (e.g., pulmonary embolus, asthma, emphysema)     Asthma, diagnosed after covid  9. PE RISK FACTORS: Do you have a history of blood clots? (or: recent major surgery, recent prolonged travel, bedridden)     No blood clots  10. OTHER SYMPTOMS: Do you have any other symptoms? (e.g., runny nose, wheezing, chest pain)       Nausea, diarrhea, chest feels heavy on right side  11. PREGNANCY: Is there any chance you are pregnant? When was your last menstrual period?       N/A 12. TRAVEL: Have you traveled out of the country in the last month? (e.g., travel history, exposures)       No  Protocols used: Cough - Acute Productive-A-AH

## 2023-10-06 NOTE — Discharge Instructions (Signed)
 Evaluation was overall reassuring.  X-ray was negative for pneumonia.  Suspect this is a viral upper respiratory infection.  Treatment is still supportive which includes rest, hydration and ibuprofen  and Tylenol as needed for fever and symptomatic relief.  If you start to have chest pain, worsening shortness of breath, uncontrolled fever, lethargy or any other concerning symptom please return to the emergency department for evaluation.  Otherwise recommend you follow-up with your PCP.  I sent Robitussin to your pharmacy for your cough.  Would recommend you take it at night.

## 2023-10-06 NOTE — ED Provider Triage Note (Signed)
 Emergency Medicine Provider Triage Evaluation Note  Justin Barrera , a 48 y.o. male  was evaluated in triage.  Pt complains of cough.  Patient reports cough with yellow sputum for the past week.  History of asthma in the past.  States that he feels tightness in the left side of his chest.  Feels similar to when he had pneumonia in the past.  Review of Systems  Positive: Cough, fevers, diarrhea Negative: Wheezing   Physical Exam  BP (!) 131/90   Pulse 74   Temp 98.2 F (36.8 C)   Resp (!) 21   Ht 5' 7 (1.702 m)   Wt 69.9 kg   SpO2 100%   BMI 24.12 kg/m  Gen:   Awake, no distress   Resp:  Normal effort  MSK:   Moves extremities without difficulty  Other:    Medical Decision Making  Medically screening exam initiated at 12:08 PM.  Appropriate orders placed.  Justin Barrera was informed that the remainder of the evaluation will be completed by another provider, this initial triage assessment does not replace that evaluation, and the importance of remaining in the ED until their evaluation is complete.   Justin Sluder, PA-C 10/06/23 1209

## 2023-10-06 NOTE — ED Triage Notes (Signed)
Cough for a week and half with yellow sputum. Pt states he has had chest pressure that is worse on the right side, last time this happened he had pneumonia. Pt feels sob on exertion

## 2023-10-06 NOTE — ED Provider Notes (Signed)
 [place] EMERGENCY DEPARTMENT AT [facility] Provider Note   CSN: [id number] Arrival date & time: 10/06/23  1130     History  Chief Complaint  Patient presents with   Cough   HPI Justin Barrera is a 48 y.o. male with history of asthma presenting for cough.  States has been going on for a week.  Cough is productive with yellow sputum.  States his family member who lives in his home also had similar symptoms.  Has some intermittent shortness of breath but no chest pain.  Denies fever.   Cough      Home Medications Prior to Admission medications   Medication Sig Start Date End Date Taking? Authorizing Provider  guaiFENesin  (ROBITUSSIN) 100 MG/5ML liquid Take 5-10 mLs (100-200 mg total) by mouth every 4 (four) hours as needed for cough or to loosen phlegm. 10/06/23  Yes Keily Lepp K, PA-C  albuterol  (PROVENTIL  HFA) 108 (90 Base) MCG/ACT inhaler Inhale 2 puffs into the lungs every 6 (six) hours as needed for wheezing or shortness of breath (coughing fit). 12/09/19   Copland, Jacques, MD  beclomethasone (QVAR ) 80 MCG/ACT inhaler Inhale 1 puff into the lungs 2 (two) times daily. 12/07/19   Tower, Laine LABOR, MD  Cannabinoids (THC FREE PO) Take by mouth daily. Vape    [provider]  Na Sulfate-K Sulfate-Mg Sulf (SUPREP BOWEL PREP KIT) 17.5-3.13-1.6 GM/177ML SOLN Take 1 kit by mouth as directed. 09/07/23   Kerman Vina HERO, NP      Allergies    Latex and Iohexol     Review of Systems   Review of Systems  Respiratory:  Positive for cough.     Physical Exam Updated Vital Signs BP 120/84   Pulse 75   Temp 98.5 F (36.9 C) (Oral)   Resp 16   Ht 5' 7 (1.702 m)   Wt 69.9 kg   SpO2 100%   BMI 24.12 kg/m  Physical Exam Vitals and nursing note reviewed.  HENT:     Head: Normocephalic and atraumatic.     Mouth/Throat:     Mouth: Mucous membranes are moist.  Eyes:     General:        Right eye: No discharge.        Left eye: No discharge.      Conjunctiva/sclera: Conjunctivae normal.  Cardiovascular:     Rate and Rhythm: Normal rate and regular rhythm.     Pulses: Normal pulses.     Heart sounds: Normal heart sounds.  Pulmonary:     Effort: Pulmonary effort is normal.     Breath sounds: Normal breath sounds. No decreased breath sounds, wheezing, rhonchi or rales.  Abdominal:     General: Abdomen is flat.     Palpations: Abdomen is soft.  Skin:    General: Skin is warm and dry.  Neurological:     General: No focal deficit present.  Psychiatric:        Mood and Affect: Mood normal.     ED Results / Procedures / Treatments   Labs (all labs ordered are listed, but only abnormal results are displayed) Labs Reviewed  RESP PANEL BY RT-PCR (RSV, FLU A&B, COVID)  [id number]    EKG None  Radiology DG Chest Portable 1 View Result Date: 10/06/2023 CLINICAL DATA:  Cough and shortness of breath EXAM: PORTABLE CHEST 1 VIEW COMPARISON:  03/26/2020 FINDINGS: The heart size and mediastinal contours are within normal limits. Both lungs are clear. The visualized skeletal structures  are unremarkable. IMPRESSION: No active disease. Electronically Signed   By: CHRISTELLA.  Shick M.D.   On: 10/06/2023 12:19    Procedures Procedures    Medications Ordered in ED Medications  ibuprofen  (ADVIL ) tablet 800 mg (has no administration in time range)    ED Course/ Medical Decision Making/ A&P                                 Medical Decision Making  48 year old well-appearing male presenting for cough.  Exam was unremarkable.  DDx includes pneumonia, asthma exacerbation, CHF exacerbation, other.  I personally reviewed and interpreted x-ray which revealed no acute disease.  Clinically does not appear to be in any respiratory distress.  He is nontoxic and hemodynamically stable.  Suspect this is a viral URI.  Advised supportive treatment at home.  Advised to follow-up with his PCP if his symptoms persisted.  Discussed discussed return precautions.   Discharge.        Final Clinical Impression(s) / ED Diagnoses Final diagnoses:  Cough, unspecified type    Rx / DC Orders ED Discharge Orders          Ordered    guaiFENesin  (ROBITUSSIN) 100 MG/5ML liquid  Every 4 hours PRN        10/06/23 1921              Dorien Bessent K, PA-C 10/06/23 CLEOTIS Suzette Pac, MD 10/07/23 1014

## 2023-10-12 ENCOUNTER — Encounter: Payer: 59 | Admitting: Gastroenterology

## 2023-10-19 ENCOUNTER — Encounter: Payer: Self-pay | Admitting: Gastroenterology

## 2023-10-26 ENCOUNTER — Other Ambulatory Visit: Payer: Self-pay | Admitting: Family Medicine

## 2023-10-26 DIAGNOSIS — R059 Cough, unspecified: Secondary | ICD-10-CM

## 2023-10-26 MED ORDER — QVAR REDIHALER 80 MCG/ACT IN AERB: 1.0000 | INHALATION_SPRAY | Freq: Two times a day (BID) | RESPIRATORY_TRACT | 1 refills | Status: DC

## 2023-10-26 MED ORDER — ALBUTEROL SULFATE HFA 108 (90 BASE) MCG/ACT IN AERS: 2.0000 | INHALATION_SPRAY | Freq: Four times a day (QID) | RESPIRATORY_TRACT | 2 refills | Status: AC | PRN

## 2023-10-26 NOTE — Telephone Encounter (Signed)
 Copied from CRM [id number]. Topic: Clinical - Prescription Issue >> Oct 26, 2023  1:55 PM Gurney Maxin H wrote: Reason for CRM: Agent tried submitting a refill request for patients beclomethasone (QVAR) 80 MCG/ACT inhaler, but system kept giving an error stating prescription couldn't be ordered. Please assist, thanks.

## 2023-10-26 NOTE — Telephone Encounter (Signed)
 Copied from CRM [id number]. Topic: Clinical - Medication Refill >> Oct 26, 2023  1:44 PM Gurney Maxin H wrote: Most Recent Primary Care Visit:  Provider: Hannah Beat  Department: [facility]  Visit Type: OFFICE VISIT  Date: 02/25/2023  Medication: albuterol (PROVENTIL HFA) 108 (90 Base) MCG/ACT inhaler, beclomethasone (QVAR) 80 MCG/ACT inhaler  Has the patient contacted their pharmacy? No, reaching out to provider (Agent: If no, request that the patient contact the pharmacy for the refill. If patient does not wish to contact the pharmacy document the reason why and proceed with request.) (Agent: If yes, when and what did the pharmacy advise?)  Is this the correct pharmacy for this prescription? Yes If no, delete pharmacy and type the correct one.  This is the patient's preferred pharmacy:  [id number] [place], [place] - [address] AT [facility] [address] [place] [address] Phone: [phone] Fax: [phone]   Has the prescription been filled recently? No  Is the patient out of the medication? Yes  Has the patient been seen for an appointment in the last year OR does the patient have an upcoming appointment? Yes  Can we respond through MyChart? Yes  Agent: Please be advised that Rx refills may take up to 3 business days. We ask that you follow-up with your pharmacy.

## 2023-10-27 ENCOUNTER — Telehealth: Payer: Self-pay | Admitting: Family Medicine

## 2023-10-27 ENCOUNTER — Encounter: Payer: 59 | Admitting: Gastroenterology

## 2023-10-27 MED ORDER — ASMANEX HFA 100 MCG/ACT IN AERO: 1.0000 | INHALATION_SPRAY | Freq: Two times a day (BID) | RESPIRATORY_TRACT | 3 refills | Status: AC

## 2023-10-27 NOTE — Telephone Encounter (Signed)
 OK.  Can you let him know that I changed him to an equivalent dose of asmanex.

## 2023-10-27 NOTE — Telephone Encounter (Addendum)
 Mr. Lannen notified as instructed by telephone.  Patient states understanding.

## 2023-10-27 NOTE — Telephone Encounter (Signed)
 Received fax from [facility] stating Qvar Redihaler 80 mcg in not covered by patient's insurance plan.  Preferred Alternatives are Pulmicort INH MCG and Asmanex HFA AER MCG.
